# Patient Record
Sex: Female | Born: 1980 | Race: Black or African American | Hispanic: No | Marital: Married | Smoking: Never smoker
Health system: Southern US, Community
[De-identification: ages and names within clinical notes are randomized; demographics above are authoritative.]

## PROBLEM LIST (undated history)

## (undated) ENCOUNTER — Inpatient Hospital Stay (HOSPITAL_COMMUNITY): Payer: Self-pay

## (undated) DIAGNOSIS — E119 Type 2 diabetes mellitus without complications: Secondary | ICD-10-CM

## (undated) HISTORY — PX: NO PAST SURGERIES: SHX2092

## (undated) HISTORY — DX: Type 2 diabetes mellitus without complications: E11.9

## (undated) HISTORY — PX: WISDOM TOOTH EXTRACTION: SHX21

---

## 2011-08-06 ENCOUNTER — Ambulatory Visit (INDEPENDENT_AMBULATORY_CARE_PROVIDER_SITE_OTHER): Payer: 59 | Admitting: Gynecology

## 2011-08-06 ENCOUNTER — Other Ambulatory Visit (HOSPITAL_COMMUNITY)
Admission: RE | Admit: 2011-08-06 | Discharge: 2011-08-06 | Disposition: A | Discharge status: Home / Self Care | Payer: 59 | Source: Ambulatory Visit | Attending: Gynecology | Admitting: Gynecology

## 2011-08-06 ENCOUNTER — Telehealth: Payer: Self-pay | Admitting: *Deleted

## 2011-08-06 ENCOUNTER — Encounter: Payer: Self-pay | Admitting: Gynecology

## 2011-08-06 VITALS — BP 120/70 | Ht 64.25 in | Wt 189.0 lb

## 2011-08-06 DIAGNOSIS — Z131 Encounter for screening for diabetes mellitus: Secondary | ICD-10-CM

## 2011-08-06 DIAGNOSIS — R823 Hemoglobinuria: Secondary | ICD-10-CM

## 2011-08-06 DIAGNOSIS — Z01419 Encounter for gynecological examination (general) (routine) without abnormal findings: Secondary | ICD-10-CM

## 2011-08-06 DIAGNOSIS — Z13220 Encounter for screening for lipoid disorders: Secondary | ICD-10-CM

## 2011-08-06 NOTE — Telephone Encounter (Signed)
PT CALLED SAYING HER PERIOD IS ON AND SHE WANTS TO KNOW IF THAT IS OKAY TO STILL HAVE ANNUAL DONE. PT INFORMED THAT THIS IS OKAY.

## 2011-08-06 NOTE — Progress Notes (Signed)
Latha Staunton 06-13-1981 [id number]        30 y.o.  for annual exam as a new patient with no complaints. Has regular menses using withdrawal birth control that she has used for 10 years affectively.  Past medical history,surgical history, medications, allergies, family history and social history were all reviewed and documented in the EPIC chart. ROS:  Was performed and pertinent positives and negatives are included in the history.  Exam: chaperone present Filed Vitals:   08/06/11 1144  BP: 120/70   General appearance  Normal Skin grossly normal Head/Neck normal with no cervical or supraclavicular adenopathy thyroid normal Lungs  clear Cardiac RR, without RMG Abdominal  soft, nontender, without masses, organomegaly or hernia Breasts  examined lying and sitting without masses, retractions, discharge or axillary adenopathy. Pelvic  Ext/BUS/vagina  normal late menses flow  Cervix  normal  Pap done  Uterus  anteverted, normal size, shape and contour, midline and mobile nontender   Adnexa  Without masses or tenderness    Anus and perineum  normal   Rectovaginal  normal sphincter tone without palpated masses or tenderness.    Assessment/Plan:  30 y.o. female for annual exam.  Doing well no complaints. Using rhythm method which is effective for them. I did review the issues of biological clock and the risks of pregnancy with advanced maternal age to include increased risk for chromosomal abnormalities as well as increased risk for metabolic complications such as diabetes hypertension. Self breast exams on a monthly basis discussed urged. Will check baseline CBC glucose lipid profile urinalysis. Assuming patient continues well she'll see me in a year, sooner as needed. She is on a multivitamin with folic acid and I encouraged her to continue this.    Dara Lords MD, 12:19 PM 08/06/2011

## 2011-08-16 ENCOUNTER — Telehealth: Payer: Self-pay | Admitting: *Deleted

## 2011-08-16 DIAGNOSIS — E7800 Pure hypercholesterolemia, unspecified: Secondary | ICD-10-CM

## 2011-08-16 NOTE — Telephone Encounter (Signed)
LM ON PT VOICE MAIL WITH THE BELOW NOTE. ORDER IN COMPUTER.

## 2011-08-16 NOTE — Telephone Encounter (Signed)
Message copied by Aura Camps on Mon Aug 16, 2011  2:40 PM ------      Message from: Dara Lords      Created: Mon Aug 16, 2011  6:46 AM       Tell patient chol 280 and LDL 183.  Recommend FLP.  If this was fasting, patient needs follow up with her primary to follow and consider RX.

## 2011-08-25 ENCOUNTER — Telehealth: Payer: Self-pay | Admitting: *Deleted

## 2011-08-25 NOTE — Telephone Encounter (Signed)
Pt requested actual numbers on her lab tests, lm of VM of Lp #'s and FBS. [name]

## 2011-11-10 ENCOUNTER — Institutional Professional Consult (permissible substitution): Payer: 59 | Admitting: Gynecology

## 2012-01-25 ENCOUNTER — Institutional Professional Consult (permissible substitution): Payer: 59 | Admitting: Gynecology

## 2012-01-26 ENCOUNTER — Institutional Professional Consult (permissible substitution): Payer: 59 | Admitting: Gynecology

## 2012-03-21 ENCOUNTER — Institutional Professional Consult (permissible substitution): Payer: 59 | Admitting: Gynecology

## 2013-11-29 DIAGNOSIS — E119 Type 2 diabetes mellitus without complications: Secondary | ICD-10-CM

## 2013-11-29 HISTORY — DX: Type 2 diabetes mellitus without complications: E11.9

## 2014-04-01 ENCOUNTER — Other Ambulatory Visit (HOSPITAL_COMMUNITY)
Admission: RE | Admit: 2014-04-01 | Discharge: 2014-04-01 | Disposition: A | Discharge status: Home / Self Care | Payer: 59 | Source: Ambulatory Visit | Attending: Gynecology | Admitting: Gynecology

## 2014-04-01 ENCOUNTER — Encounter: Payer: Self-pay | Admitting: Gynecology

## 2014-04-01 ENCOUNTER — Ambulatory Visit (INDEPENDENT_AMBULATORY_CARE_PROVIDER_SITE_OTHER): Payer: 59 | Admitting: Gynecology

## 2014-04-01 VITALS — BP 120/76 | Ht 64.0 in | Wt 206.0 lb

## 2014-04-01 DIAGNOSIS — Z01419 Encounter for gynecological examination (general) (routine) without abnormal findings: Secondary | ICD-10-CM | POA: Insufficient documentation

## 2014-04-01 DIAGNOSIS — Z1151 Encounter for screening for human papillomavirus (HPV): Secondary | ICD-10-CM | POA: Insufficient documentation

## 2014-04-01 LAB — LIPID PANEL
CHOLESTEROL: 240 mg/dL — AB (ref 0–200)
HDL: 58 mg/dL (ref >39)
LDL Cholesterol: 159 mg/dL — ABNORMAL HIGH (ref 0–99)
Total CHOL/HDL Ratio: 4.1 Ratio
Triglycerides: 114 mg/dL (ref <150)
VLDL: 23 mg/dL (ref 0–40)

## 2014-04-01 LAB — CBC WITH DIFFERENTIAL/PLATELET
Basophils Absolute: 0 10*3/uL (ref 0.0–0.1)
Basophils Relative: 0 % (ref 0–1)
EOS PCT: 3 % (ref 0–5)
Eosinophils Absolute: 0.2 10*3/uL (ref 0.0–0.7)
HCT: 40.2 % (ref 36.0–46.0)
HEMOGLOBIN: 13.8 g/dL (ref 12.0–15.0)
LYMPHS ABS: 2.4 10*3/uL (ref 0.7–4.0)
LYMPHS PCT: 33 % (ref 12–46)
MCH: 27.9 pg (ref 26.0–34.0)
MCHC: 34.3 g/dL (ref 30.0–36.0)
MCV: 81.2 fL (ref 78.0–100.0)
MONOS PCT: 7 % (ref 3–12)
Monocytes Absolute: 0.5 10*3/uL (ref 0.1–1.0)
NEUTROS PCT: 57 % (ref 43–77)
Neutro Abs: 4.2 10*3/uL (ref 1.7–7.7)
PLATELETS: 357 10*3/uL (ref 150–400)
RBC: 4.95 MIL/uL (ref 3.87–5.11)
RDW: 14.3 % (ref 11.5–15.5)
WBC: 7.3 10*3/uL (ref 4.0–10.5)

## 2014-04-01 LAB — COMPREHENSIVE METABOLIC PANEL
ALT: 18 U/L (ref 0–35)
AST: 16 U/L (ref 0–37)
Albumin: 4.3 g/dL (ref 3.5–5.2)
Alkaline Phosphatase: 80 U/L (ref 39–117)
BUN: 11 mg/dL (ref 6–23)
CALCIUM: 9.3 mg/dL (ref 8.4–10.5)
CHLORIDE: 104 meq/L (ref 96–112)
CO2: 22 meq/L (ref 19–32)
Creat: 0.75 mg/dL (ref 0.50–1.10)
Glucose, Bld: 138 mg/dL — ABNORMAL HIGH (ref 70–99)
Potassium: 4 mEq/L (ref 3.5–5.3)
SODIUM: 138 meq/L (ref 135–145)
TOTAL PROTEIN: 7.4 g/dL (ref 6.0–8.3)
Total Bilirubin: 0.3 mg/dL (ref 0.2–1.2)

## 2014-04-01 NOTE — Patient Instructions (Signed)
Followup in one year, sooner as needed. Start on a multivitamin with folic acid such as an over-the-counter prenatal vitamin.  You may obtain a copy of any labs that were done today by logging onto MyChart as outlined in the instructions provided with your AVS (after visit summary). The office will not call with normal lab results but certainly if there are any significant abnormalities then we will contact you.   Health Maintenance, Female A healthy lifestyle and preventative care can promote health and wellness.  Maintain regular health, dental, and eye exams.  Eat a healthy diet. Foods like vegetables, fruits, whole grains, low-fat dairy products, and lean protein foods contain the nutrients you need without too many calories. Decrease your intake of foods high in solid fats, added sugars, and salt. Get information about a proper diet from your caregiver, if necessary.  Regular physical exercise is one of the most important things you can do for your health. Most adults should get at least 150 minutes of moderate-intensity exercise (any activity that increases your heart rate and causes you to sweat) each week. In addition, most adults need muscle-strengthening exercises on 2 or more days a week.   Maintain a healthy weight. The body mass index (BMI) is a screening tool to identify possible weight problems. It provides an estimate of body fat based on height and weight. Your caregiver can help determine your BMI, and can help you achieve or maintain a healthy weight. For adults 20 years and older:  A BMI below 18.5 is considered underweight.  A BMI of 18.5 to 24.9 is normal.  A BMI of 25 to 29.9 is considered overweight.  A BMI of 30 and above is considered obese.  Maintain normal blood lipids and cholesterol by exercising and minimizing your intake of saturated fat. Eat a balanced diet with plenty of fruits and vegetables. Blood tests for lipids and cholesterol should begin at age 30 and  be repeated every 5 years. If your lipid or cholesterol levels are high, you are over 50, or you are a high risk for heart disease, you may need your cholesterol levels checked more frequently.Ongoing high lipid and cholesterol levels should be treated with medicines if diet and exercise are not effective.  If you smoke, find out from your caregiver how to quit. If you do not use tobacco, do not start.  Lung cancer screening is recommended for adults aged 81 80 years who are at high risk for developing lung cancer because of a history of smoking. Yearly low-dose computed tomography (CT) is recommended for people who have at least a 30-pack-year history of smoking and are a current smoker or have quit within the past 15 years. A pack year of smoking is smoking an average of 1 pack of cigarettes a day for 1 year (for example: 1 pack a day for 30 years or 2 packs a day for 15 years). Yearly screening should continue until the smoker has stopped smoking for at least 15 years. Yearly screening should also be stopped for people who develop a health problem that would prevent them from having lung cancer treatment.  If you are pregnant, do not drink alcohol. If you are breastfeeding, be very cautious about drinking alcohol. If you are not pregnant and choose to drink alcohol, do not exceed 1 drink per day. One drink is considered to be 12 ounces (355 mL) of beer, 5 ounces (148 mL) of wine, or 1.5 ounces (44 mL) of liquor.  Avoid  use of street drugs. Do not share needles with anyone. Ask for help if you need support or instructions about stopping the use of drugs.  High blood pressure causes heart disease and increases the risk of stroke. Blood pressure should be checked at least every 1 to 2 years. Ongoing high blood pressure should be treated with medicines, if weight loss and exercise are not effective.  If you are 87 to 33 years old, ask your caregiver if you should take aspirin to prevent  strokes.  Diabetes screening involves taking a blood sample to check your fasting blood sugar level. This should be done once every 3 years, after age 69, if you are within normal weight and without risk factors for diabetes. Testing should be considered at a younger age or be carried out more frequently if you are overweight and have at least 1 risk factor for diabetes.  Breast cancer screening is essential preventative care for women. You should practice "breast self-awareness." This means understanding the normal appearance and feel of your breasts and may include breast self-examination. Any changes detected, no matter how small, should be reported to a caregiver. Women in their 15s and 30s should have a clinical breast exam (CBE) by a caregiver as part of a regular health exam every 1 to 3 years. After age 104, women should have a CBE every year. Starting at age 10, women should consider having a mammogram (breast X-ray) every year. Women who have a family history of breast cancer should talk to their caregiver about genetic screening. Women at a high risk of breast cancer should talk to their caregiver about having an MRI and a mammogram every year.  Breast cancer gene (BRCA)-related cancer risk assessment is recommended for women who have family members with BRCA-related cancers. BRCA-related cancers include breast, ovarian, tubal, and peritoneal cancers. Having family members with these cancers may be associated with an increased risk for harmful changes (mutations) in the breast cancer genes BRCA1 and BRCA2. Results of the assessment will determine the need for genetic counseling and BRCA1 and BRCA2 testing.  The Pap test is a screening test for cervical cancer. Women should have a Pap test starting at age 47. Between ages 18 and 59, Pap tests should be repeated every 2 years. Beginning at age 1, you should have a Pap test every 3 years as long as the past 3 Pap tests have been normal. If you had a  hysterectomy for a problem that was not cancer or a condition that could lead to cancer, then you no longer need Pap tests. If you are between ages 3 and 34, and you have had normal Pap tests going back 10 years, you no longer need Pap tests. If you have had past treatment for cervical cancer or a condition that could lead to cancer, you need Pap tests and screening for cancer for at least 20 years after your treatment. If Pap tests have been discontinued, risk factors (such as a new sexual partner) need to be reassessed to determine if screening should be resumed. Some women have medical problems that increase the chance of getting cervical cancer. In these cases, your caregiver may recommend more frequent screening and Pap tests.  The human papillomavirus (HPV) test is an additional test that may be used for cervical cancer screening. The HPV test looks for the virus that can cause the cell changes on the cervix. The cells collected during the Pap test can be tested for HPV. The HPV  test could be used to screen women aged 85 years and older, and should be used in women of any age who have unclear Pap test results. After the age of 58, women should have HPV testing at the same frequency as a Pap test.  Colorectal cancer can be detected and often prevented. Most routine colorectal cancer screening begins at the age of 49 and continues through age 69. However, your caregiver may recommend screening at an earlier age if you have risk factors for colon cancer. On a yearly basis, your caregiver may provide home test kits to check for hidden blood in the stool. Use of a small camera at the end of a tube, to directly examine the colon (sigmoidoscopy or colonoscopy), can detect the earliest forms of colorectal cancer. Talk to your caregiver about this at age 78, when routine screening begins. Direct examination of the colon should be repeated every 5 to 10 years through age 46, unless early forms of pre-cancerous  polyps or small growths are found.  Hepatitis C blood testing is recommended for all people born from [birth year] through 1965 and any individual with known risks for hepatitis C.  Practice safe sex. Use condoms and avoid high-risk sexual practices to reduce the spread of sexually transmitted infections (STIs). Sexually active women aged 7 and younger should be checked for Chlamydia, which is a common sexually transmitted infection. Older women with new or multiple partners should also be tested for Chlamydia. Testing for other STIs is recommended if you are sexually active and at increased risk.  Osteoporosis is a disease in which the bones lose minerals and strength with aging. This can result in serious bone fractures. The risk of osteoporosis can be identified using a bone density scan. Women ages 45 and over and women at risk for fractures or osteoporosis should discuss screening with their caregivers. Ask your caregiver whether you should be taking a calcium supplement or vitamin D to reduce the rate of osteoporosis.  Menopause can be associated with physical symptoms and risks. Hormone replacement therapy is available to decrease symptoms and risks. You should talk to your caregiver about whether hormone replacement therapy is right for you.  Use sunscreen. Apply sunscreen liberally and repeatedly throughout the day. You should seek shade when your shadow is shorter than you. Protect yourself by wearing long sleeves, pants, a wide-brimmed hat, and sunglasses year round, whenever you are outdoors.  Notify your caregiver of new moles or changes in moles, especially if there is a change in shape or color. Also notify your caregiver if a mole is larger than the size of a pencil eraser.  Stay current with your immunizations. Document Released: 05/31/2011 Document Revised: 03/12/2013 Document Reviewed: 05/31/2011 [facility] Patient Information 2014 [place].

## 2014-04-01 NOTE — Addendum Note (Signed)
Addended by: Dayna BarkerGARDNER, Amaad Byers K on: 04/01/2014 11:55 AM   Modules accepted: Orders

## 2014-04-01 NOTE — Progress Notes (Signed)
Natalie Morales May 19, 1981 [id number]        33 y.o.  G0P0 for annual exam.  Several issues noted below.  Past medical history,surgical history, problem list, medications, allergies, family history and social history were all reviewed and documented as reviewed in the EPIC chart.  ROS:  12 system ROS performed with pertinent positives and negatives included in the history, assessment and plan.  Included Systems: General, HEENT, Neck, Cardiovascular, Pulmonary, Gastrointestinal, Genitourinary, Musculoskeletal, Dermatologic, Endocrine, Hematological, Neurologic, Psychiatric Additional significant findings : None   Exam: Kim assistant Filed Vitals:   04/01/14 1037  BP: 120/76  Height: 5\' 4"  (1.626 m)  Weight: 206 lb (93.441 kg)   General appearance:  Normal affect, orientation and appearance. Skin: Grossly normal HEENT: Without gross lesions.  No cervical or supraclavicular adenopathy. Thyroid normal.  Lungs:  Clear without wheezing, rales or rhonchi Cardiac: RR, without RMG Abdominal:  Soft, nontender, without masses, guarding, rebound, organomegaly or hernia Breasts:  Examined lying and sitting without masses, retractions, discharge or axillary adenopathy. Bilateral nipple piercing noted. Pelvic:  Ext/BUS/vagina normal  Cervix normal. Pap/HPV  Uterus anteverted, normal size, shape and contour, midline and mobile nontender   Adnexa  Without masses or tenderness    Anus and perineum  Normal   Rectovaginal  Normal sphincter tone without palpated masses or tenderness.    Assessment/Plan:  33 y.o. G0P0 female for annual exam with regular menses, no birth control.   1. Contraception. Patient not using birth control. Not actively trying but not preventing. Would accept pregnancy if it occurs. Currently not on multivitamin with folic acid. Recommended she start a multivitamin with folic acid preconceptionally. We've already discussed the issues of biological clock and advancing maternal age  both from a infertility standpoint as well as chromosomal/metabolic complications of pregnancy issues. Patient not interested in more aggressively pursuing pregnancy at this time. 2. Pap smear 2012. Pap/HPV today. No history of abnormal Pap smears previously. Continue with 3-5 year interval screening. 3. Mammography. Plan closer to 40. No strong family history of breast cancer. SBE monthly reviewed. 4. Health maintenance. Baseline CBC comprehensive metabolic panel lipid profile urinalysis ordered. Followup in one year, sooner as needed.   Note: This document was prepared with digital dictation and possible smart phrase technology. Any transcriptional errors that result from this process are unintentional.   Dara Lordsimothy P Fontaine MD, 11:07 AM 04/01/2014

## 2014-04-02 ENCOUNTER — Other Ambulatory Visit: Payer: Self-pay | Admitting: Gynecology

## 2014-04-02 DIAGNOSIS — R7309 Other abnormal glucose: Secondary | ICD-10-CM

## 2014-04-02 DIAGNOSIS — E7800 Pure hypercholesterolemia, unspecified: Secondary | ICD-10-CM

## 2014-04-02 LAB — URINALYSIS W MICROSCOPIC + REFLEX CULTURE
Bacteria, UA: NONE SEEN
Bilirubin Urine: NEGATIVE
CASTS: NONE SEEN
Crystals: NONE SEEN
Glucose, UA: NEGATIVE mg/dL
Hgb urine dipstick: NEGATIVE
Ketones, ur: NEGATIVE mg/dL
LEUKOCYTES UA: NEGATIVE
Nitrite: NEGATIVE
PH: 5.5 (ref 5.0–8.0)
Protein, ur: NEGATIVE mg/dL
Specific Gravity, Urine: >1.03 — ABNORMAL HIGH (ref 1.005–1.030)
UROBILINOGEN UA: 0.2 mg/dL (ref 0.0–1.0)

## 2014-04-03 ENCOUNTER — Other Ambulatory Visit: Payer: 59

## 2014-04-04 ENCOUNTER — Other Ambulatory Visit: Payer: 59

## 2014-04-04 DIAGNOSIS — E7800 Pure hypercholesterolemia, unspecified: Secondary | ICD-10-CM

## 2014-04-04 DIAGNOSIS — R7309 Other abnormal glucose: Secondary | ICD-10-CM

## 2014-04-04 LAB — GLUCOSE, RANDOM: Glucose, Bld: 123 mg/dL — ABNORMAL HIGH (ref 70–99)

## 2014-04-04 LAB — LIPID PANEL
Cholesterol: 244 mg/dL — ABNORMAL HIGH (ref 0–200)
HDL: 62 mg/dL (ref >39)
LDL Cholesterol: 158 mg/dL — ABNORMAL HIGH (ref 0–99)
Total CHOL/HDL Ratio: 3.9 Ratio
Triglycerides: 120 mg/dL (ref <150)
VLDL: 24 mg/dL (ref 0–40)

## 2014-04-05 LAB — HEMOGLOBIN A1C
HEMOGLOBIN A1C: 6.9 % — AB (ref <5.7)
MEAN PLASMA GLUCOSE: 151 mg/dL — AB (ref <117)

## 2014-04-09 ENCOUNTER — Telehealth: Payer: Self-pay

## 2014-04-09 DIAGNOSIS — E119 Type 2 diabetes mellitus without complications: Secondary | ICD-10-CM

## 2014-04-09 NOTE — Telephone Encounter (Signed)
Patient's labs were diagnostic of diabetes. She did not have PCP so I recommended [facility]. She called and first appt she could get was 05/2714.  She was hoping to get in sooner so she called me.  I called around and all the [place] sites are about 8 weeks out because a lot of the providers are not taking new patients.  The only thing I can think of is if you think it would be ok for us to refer her to Dr. Elvera LennoxGherghe who it endocrinologist with [facility].  Last referral Victorino DikeJennifer did with her took about a week to get the patient seen.  Ok to refer her to her?

## 2014-04-09 NOTE — Telephone Encounter (Signed)
Patient informed that Victorino DikeJennifer will take care of referral to Dr. Elvera LennoxGherghe and she should hear about appt soon.

## 2014-04-09 NOTE — Telephone Encounter (Signed)
ok 

## 2014-04-10 NOTE — Telephone Encounter (Signed)
Referral placed for Dr.Gherghe, they will contact patient

## 2014-04-11 NOTE — Telephone Encounter (Signed)
Appointment 04/16/14 @ 8:15 am

## 2014-04-16 ENCOUNTER — Encounter: Payer: Self-pay | Admitting: Internal Medicine

## 2014-04-16 ENCOUNTER — Ambulatory Visit (INDEPENDENT_AMBULATORY_CARE_PROVIDER_SITE_OTHER): Payer: 59 | Admitting: Internal Medicine

## 2014-04-16 VITALS — BP 112/70 | HR 95 | Temp 98.3°F | Resp 12 | Ht 64.0 in | Wt 206.0 lb

## 2014-04-16 DIAGNOSIS — E119 Type 2 diabetes mellitus without complications: Secondary | ICD-10-CM | POA: Insufficient documentation

## 2014-04-16 LAB — MICROALBUMIN / CREATININE URINE RATIO
Creatinine,U: 262.2 mg/dL
Microalb Creat Ratio: 0.2 mg/g (ref 0.0–30.0)
Microalb, Ur: 0.6 mg/dL (ref 0.0–1.9)

## 2014-04-16 LAB — HEMOGLOBIN A1C: HEMOGLOBIN A1C: 6.8 % — AB (ref 4.6–6.5)

## 2014-04-16 MED ORDER — ONETOUCH LANCETS MISC: Status: DC

## 2014-04-16 MED ORDER — GLUCOSE BLOOD VI STRP: ORAL_STRIP | Status: DC

## 2014-04-16 NOTE — Patient Instructions (Addendum)
Please start checking sugars daily, rotating checks. Please return in 1 month with your sugar log.   Please consider the following ways to cut down carbs and fat and increase fiber and micronutrients in your diet: - substitute whole grain for white bread or pasta - substitute brown rice for white rice - substitute 90-calorie flat bread pieces for slices of bread when possible - substitute sweet potatoes or yams for white potatoes - substitute humus for margarine - substitute tofu for cheese when possible - substitute almond or rice milk for regular milk (would not drink soy milk daily due to concern for soy estrogen influence on breast cancer risk) - substitute dark chocolate for other sweets when possible - substitute water - can add lemon or orange slices for taste - for diet sodas (artificial sweeteners will trick your body that you can eat sweets without getting calories and will lead you to overeating and weight gain in the long run) - do not skip breakfast or other meals (this will slow down the metabolism and will result in more weight gain over time)  - can try smoothies made from fruit and almond/rice milk in am instead of regular breakfast - can also try old-fashioned (not instant) oatmeal made with almond/rice milk in am - order the dressing on the side when eating salad at a restaurant (pour less than half of the dressing on the salad) - eat as little meat as possible - can try juicing, but should not forget that juicing will get rid of the fiber, so would alternate with eating raw veg./fruits or drinking smoothies - use as little oil as possible, even when using olive oil - can dress a salad with a mix of balsamic vinegar and lemon juice, for e.g. - use agave nectar, stevia sugar, or regular sugar rather than artificial sweateners - steam or broil/roast veggies  - snack on veggies/fruit/nuts (unsalted, preferably) when possible, rather than processed foods - reduce or eliminate  aspartame in diet (it is in diet sodas, chewing gum, etc) Read the labels!  Try to read Dr. Katherina RightNeal Barnard's book: "Program for Reversing Diabetes" for the vegan concept and other ideas for healthy eating.   PATIENT INSTRUCTIONS FOR TYPE 2 DIABETES:  DIET AND EXERCISE Diet and exercise is an important part of diabetic treatment.  We recommended aerobic exercise in the form of brisk walking (working between 40-60% of maximal aerobic capacity, similar to brisk walking) for 150 minutes per week (such as 30 minutes five days per week) along with 3 times per week performing 'resistance' training (using various gauge rubber tubes with handles) 5-10 exercises involving the major muscle groups (upper body, lower body and core) performing 10-15 repetitions (or near fatigue) each exercise. Start at half the above goal but build slowly to reach the above goals. If limited by weight, joint pain, or disability, we recommend daily walking in a swimming pool with water up to waist to reduce pressure from joints while allow for adequate exercise.    BLOOD GLUCOSES Monitoring your blood glucoses is important for continued management of your diabetes. Please check your blood glucoses 1-4 times a day: fasting, before meals and at bedtime (you can rotate these measurements - e.g. one day check before the 3 meals, the next day check before 2 of the meals and before bedtime, etc.).   HYPOGLYCEMIA (low blood sugar) Hypoglycemia is usually a reaction to not eating, exercising, or taking too much insulin/ other diabetes drugs.  Symptoms include tremors, sweating,  hunger, confusion, headache, etc. Treat IMMEDIATELY with 15 grams of Carbs:   4 glucose tablets    cup regular juice/soda   2 tablespoons raisins   4 teaspoons sugar   1 tablespoon honey Recheck blood glucose in 15 mins and repeat above if still symptomatic/blood glucose <100.  RECOMMENDATIONS TO REDUCE YOUR RISK OF DIABETIC COMPLICATIONS: * Take your  prescribed MEDICATION(S) * Follow a DIABETIC diet: Complex carbs, fiber rich foods, (monounsaturated and polyunsaturated) fats * AVOID saturated/trans fats, high fat foods, >2,300 mg salt per day. * EXERCISE at least 5 times a week for 30 minutes or preferably daily.  * DO NOT SMOKE OR DRINK more than 1 drink a day. * Check your FEET every day. Do not wear tightfitting shoes. Contact us if you develop an ulcer * See your EYE doctor once a year or more if needed * Get a FLU shot once a year * Get a PNEUMONIA vaccine once before and once after age 33 years  GOALS:  * Your Hemoglobin A1c of <7%  * fasting sugars need to be <130 * after meals sugars need to be <180 (2h after you start eating) * Your Systolic BP should be 140 or lower  * Your Diastolic BP should be 80 or lower  * Your HDL (Good Cholesterol) should be 40 or higher  * Your LDL (Bad Cholesterol) should be 100 or lower. * Your Triglycerides should be 150 or lower  * Your Urine microalbumin (kidney function) should be <30 * Your Body Mass Index should be 25 or lower   We will be glad to help you achieve these goals. Our telephone number is: [phone].

## 2014-04-16 NOTE — Progress Notes (Signed)
Patient ID: Natalie Morales, female   DOB: 29-Oct-1981, 33 y.o.   MRN: [id number]  HPI: Natalie Morales is a 33 y.o.-year-old female, referred by Dr. Audie BoxFontaine, for management of DM2, non-insulin-dependent, uncontrolled, without complications.  Patient has been recently diagnosed with diabetes in 03/2014. Last hemoglobin A1c was: Lab Results  Component Value Date   HGBA1C 6.9* 04/04/2014   Pt is not on meds for her diabetes.  Pt does not check her sugars.  ? Lows; ? hypoglycemia awareness.   Pt's meals are: - Breakfast: skips - Lunch: fast food - Dinner: fast food - Snacks: 2x a day: tortilla chips and salsa + cheese; olives   - no CKD, last BUN/creatinine:  Lab Results  Component Value Date   BUN 11 04/01/2014   CREATININE 0.75 04/01/2014   - last set of lipids: Lab Results  Component Value Date   CHOL 244* 04/04/2014   HDL 62 04/04/2014   LDLCALC 696158* 04/04/2014   TRIG 120 04/04/2014   CHOLHDL 3.9 04/04/2014   - No previous dilated eye exam. Denies blurry vision. - no numbness and tingling in her feet.  Pt does not have FH of DM. Mother has SLE.  Pt is trying to get pregnant.  ROS: Constitutional: no weight gain/loss, + fatigue, no subjective hyperthermia/hypothermia, + nocturia, no increased thirst Eyes: no blurry vision, no xerophthalmia ENT: no sore throat, no nodules palpated in throat, no dysphagia/odynophagia, no hoarseness Cardiovascular: no CP/SOB/palpitations/leg swelling Respiratory: no cough/SOB Gastrointestinal: no N/V/D/C, + heartburn Musculoskeletal: no muscle/joint aches Skin: no rashes; + excessive hair growth Neurological: no tremors/numbness/tingling/dizziness Psychiatric: no depression/anxiety  Past Medical History  Diagnosis Date  . Diabetes mellitus without complication    No past surgical history on file. History   Social History  . Marital Status: Married    Spouse Name: N/A    Number of Children: 0        Occupational History  . 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 Smoking status: Never Smoker   . Smokeless tobacco: Never Used  . Alcohol Use: Yes     Comment: Rare - socially  . Drug Use: No  . Sexual Activity: Yes    Partners: Male    Birth Control/ Protection: None   Meds:  Prenatal vitamins  No Known Allergies  Family History  Problem Relation Age of Onset  . Lupus Mother    PE: BP 112/70  Pulse 95  Temp(Src) 98.3 F (36.8 C) (Oral)  Resp 12  Ht 5\' 4"  (1.626 m)  Wt 206 lb (93.441 kg)  BMI 35.34 kg/m2  SpO2 97%  LMP 03/25/2014 Wt Readings from Last 3 Encounters:  04/16/14 206 lb (93.441 kg)  04/01/14 206 lb (93.441 kg)  08/06/11 189 lb (85.73 kg)   Constitutional: obese, in NAD Eyes: PERRLA, EOMI, no exophthalmos ENT: moist mucous membranes, no thyromegaly, no cervical lymphadenopathy Cardiovascular: RRR, No MRG Respiratory: CTA B Gastrointestinal: abdomen soft, NT, ND, BS+ Musculoskeletal: no deformities, strength intact in all 4 Skin: moist, warm, no rashes, + acanthosis nigricans on neck Neurological: no tremor with outstretched hands, DTR normal in all 4  ASSESSMENT: 1. DM2, non-insulin-dependent, controlled, without complications  PLAN:  1. Patient with new dx of DM2 - We discussed about options for treatment, and I suggested to start working on her diet (given suggestions for healthy substitutions and referred her to nutrition) and try to lose 7-10% of her weight and also start exercising. Will see  her back in 3 mo to see if additional tx is needed.  - we discussed about short and long term complications of DM and how to avoid them  Patient Instructions  Please start checking sugars daily, rotating checks. Please return in 1 month with your sugar log.   - Strongly advised her to start checking sugars at different times of the day - check once a day, rotating checks - given sugar log and advised how to fill it and to bring it at next appt  - we demonstrated  glucometer use and gave her a OneTouch Verio meter.  - Sent strips and lancets to pharmacy - given foot care handout and explained the principles  - given instructions for hypoglycemia management "15-15 rule"  - advised for yearly eye exams >> she will let me know if she needs a referral - She was advised to let me know if she becomes pregnant. - will repeat a HbA1c (to confirm dx of DM) and check an ACR today - Return to clinic in 3 mo with sugar log   Office Visit on 04/16/2014  Component Date Value Ref Range Status  . Microalb, Ur 04/16/2014 0.6  0.0 - 1.9 mg/dL Final  . Creatinine,U 16/10/960405/19/2015 262.2   Final  . Microalb Creat Ratio 04/16/2014 0.2  0.0 - 30.0 mg/g Final  . Hemoglobin A1C 04/16/2014 6.8* 4.6 - 6.5 % Final   Glycemic Control Guidelines for People with Diabetes:Non Diabetic:  <6%Goal of Therapy: <7%Additional Action Suggested:  >8%    Msg sent: Dear Ms Quillian QuinceBliss, HbA1c still elevated, confirming diabetes. No proteins in urine, which is great! Sincerely, Carlus Pavlovristina Mivaan Corbitt MD

## 2014-04-23 ENCOUNTER — Other Ambulatory Visit: Payer: Self-pay

## 2014-05-30 ENCOUNTER — Ambulatory Visit: Payer: 59 | Admitting: *Deleted

## 2014-06-18 ENCOUNTER — Encounter: Payer: Self-pay | Admitting: Internal Medicine

## 2014-06-19 ENCOUNTER — Encounter: Payer: Self-pay | Admitting: Family Medicine

## 2014-06-19 ENCOUNTER — Encounter: Payer: Self-pay | Admitting: Internal Medicine

## 2014-06-24 ENCOUNTER — Ambulatory Visit: Payer: 59 | Admitting: Family Medicine

## 2014-07-08 LAB — HM DIABETES EYE EXAM

## 2014-07-25 ENCOUNTER — Ambulatory Visit (INDEPENDENT_AMBULATORY_CARE_PROVIDER_SITE_OTHER): Payer: 59 | Admitting: Internal Medicine

## 2014-07-25 ENCOUNTER — Encounter: Payer: Self-pay | Admitting: Internal Medicine

## 2014-07-25 VITALS — BP 102/64 | HR 107 | Temp 98.6°F | Resp 12 | Wt 193.0 lb

## 2014-07-25 DIAGNOSIS — E119 Type 2 diabetes mellitus without complications: Secondary | ICD-10-CM

## 2014-07-25 LAB — HEMOGLOBIN A1C: Hgb A1c MFr Bld: 5.9 % (ref 4.6–6.5)

## 2014-07-25 NOTE — Patient Instructions (Addendum)
Continue checking sugars daily, rotating checks. Please return in 3 months with your sugar log.  Please stop at the lab.  KEEP UP THE GREAT WORK!

## 2014-07-25 NOTE — Progress Notes (Signed)
Patient ID: Natalie Morales, female   DOB: 1981-03-12, 33 y.o.   MRN: [id number]  HPI: Natalie Morales is a 33 y.o.-year-old female, initially referred by Dr. Audie Box, for management of DM2, dx 03/2014, diet-controlled, without complications. Last visit 3 mo ago.  She started a vegan diet 1 mo ago >> lost 14 lbs!!! She feels great!  Last hemoglobin A1c was: Lab Results  Component Value Date   HGBA1C 6.8* 04/16/2014   HGBA1C 6.9* 04/04/2014   Pt's diabetes is diet controlled.  Pt was not not checking her sugars, however, she started to do so after our last visit. She checks 1-2 x a day: Lowest: 80s, Highest 140s.  - am: 90-106 - 2h after b'fast: 106, 109 - lunch: 106 - 2h after lunch: 115, 127 - dinner: 80-129 - 2h after dinner: 113-121 - nighttime: 85-108  - no CKD, last BUN/creatinine:  Lab Results  Component Value Date   BUN 11 04/01/2014   CREATININE 0.75 04/01/2014  Last ACR: Microalb, Ur 04/16/2014 0.6  0.0 - 1.9 mg/dL  Creatinine,U 78/29/5621 262.2    Microalb Creat Ratio 04/16/2014 0.2  0.0 - 30.0 mg/g   - last set of lipids: Lab Results  Component Value Date   CHOL 244* 04/04/2014   HDL 62 04/04/2014   LDLCALC 308* 04/04/2014   TRIG 120 04/04/2014   CHOLHDL 3.9 04/04/2014   - last dilated eye exam: 07/08/2014. No DR. Denies blurry vision. - no numbness and tingling in her feet.  I reviewed pt's medications, allergies, PMH, social hx, family hx and no changes required, except as mentioned above.  Pt is trying to get pregnant.  ROS: Constitutional: no weight gain/loss, no fatigue, no subjective hyperthermia/hypothermia, no increased thirst Eyes: no blurry vision, no xerophthalmia ENT: no sore throat, no nodules palpated in throat, no dysphagia/odynophagia, no hoarseness Cardiovascular: no CP/SOB/palpitations/leg swelling Respiratory: + cough/no SOB Gastrointestinal: no N/V/D/C/ heartburn Musculoskeletal: no muscle/joint aches Skin: no rashes Neurological: no  tremors/numbness/tingling/dizziness  PE: BP 102/64  Pulse 107  Temp(Src) 98.6 F (37 C) (Oral)  Resp 12  Wt 193 lb (87.544 kg)  SpO2 96% Wt Readings from Last 3 Encounters:  07/25/14 193 lb (87.544 kg)  04/16/14 206 lb (93.441 kg)  04/01/14 206 lb (93.441 kg)   Constitutional: obese, in NAD Eyes: PERRLA, EOMI, no exophthalmos ENT: moist mucous membranes, no thyromegaly, no cervical lymphadenopathy Cardiovascular: RRR, No MRG Respiratory: CTA B Gastrointestinal: abdomen soft, NT, ND, BS+ Musculoskeletal: no deformities, strength intact in all 4 Skin: moist, warm, no rashes, + acanthosis nigricans on neck Neurological: no tremor with outstretched hands, DTR normal in all 4  ASSESSMENT: 1. DM2, diet-controlled, without complications  PLAN:  1. Patient with new dx of DM2 - at last visit, we discussed about options for treatment, and I suggested to start working on her diet (given suggestions for healthy substitutions and referred her to nutrition) and try to lose 7-10% of her weight and also start exercising. She started a vegan diet and she feels great! She also lost 14 lbs and only started the diet 1 mo ago. Sugars are all at goal. - for now, I suggested to Patient Instructions  Continue checking sugars daily, rotating checks. Please return in 3 months with your sugar log.  Please stop at the lab. KEEP UP THE GREAT WORK! - continue checking sugars at different times of the day - check once a day, rotating checks - given more sugar logs  - up to date with  yearly eye exams - She was advised to let me know if she becomes pregnant. - will repeat a HbA1c today - Return to clinic in 3 mo with sugar log   Office Visit on 07/25/2014  Component Date Value Ref Range Status  . Hemoglobin A1C 07/25/2014 5.9  4.6 - 6.5 % Final   Glycemic Control Guidelines for People with Diabetes:Non Diabetic:  <6%Goal of Therapy: <7%Additional Action Suggested:  >8%   . HM Diabetic Eye Exam  07/08/2014 No Retinopathy  No Retinopathy Final   Excellent hbA1c!

## 2014-10-01 ENCOUNTER — Ambulatory Visit: Payer: 59 | Admitting: Medical

## 2014-10-29 ENCOUNTER — Encounter: Payer: Self-pay | Admitting: Women's Health

## 2014-10-29 ENCOUNTER — Ambulatory Visit (INDEPENDENT_AMBULATORY_CARE_PROVIDER_SITE_OTHER): Payer: 59 | Admitting: Women's Health

## 2014-10-29 VITALS — BP 118/80 | Ht 63.0 in | Wt 192.0 lb

## 2014-10-29 DIAGNOSIS — N912 Amenorrhea, unspecified: Secondary | ICD-10-CM

## 2014-10-29 LAB — PREGNANCY, URINE: Preg Test, Ur: POSITIVE

## 2014-10-29 NOTE — Patient Instructions (Signed)
First Trimester of Pregnancy The first trimester of pregnancy is from week 1 until the end of week 12 (months 1 through 3). A week after a sperm fertilizes an egg, the egg will implant on the wall of the uterus. This embryo will begin to develop into a baby. Genes from you and your partner are forming the baby. The female genes determine whether the baby is a boy or a girl. At 6-8 weeks, the eyes and face are formed, and the heartbeat can be seen on ultrasound. At the end of 12 weeks, all the baby's organs are formed.  Now that you are pregnant, you will want to do everything you can to have a healthy baby. Two of the most important things are to get good prenatal care and to follow your health care provider's instructions. Prenatal care is all the medical care you receive before the baby's birth. This care will help prevent, find, and treat any problems during the pregnancy and childbirth. BODY CHANGES Your body goes through many changes during pregnancy. The changes vary from woman to woman.   You may gain or lose a couple of pounds at first.  You may feel sick to your stomach (nauseous) and throw up (vomit). If the vomiting is uncontrollable, call your health care provider.  You may tire easily.  You may develop headaches that can be relieved by medicines approved by your health care provider.  You may urinate more often. Painful urination may mean you have a bladder infection.  You may develop heartburn as a result of your pregnancy.  You may develop constipation because certain hormones are causing the muscles that push waste through your intestines to slow down.  You may develop hemorrhoids or swollen, bulging veins (varicose veins).  Your breasts may begin to grow larger and become tender. Your nipples may stick out more, and the tissue that surrounds them (areola) may become darker.  Your gums may bleed and may be sensitive to brushing and flossing.  Dark spots or blotches (chloasma,  mask of pregnancy) may develop on your face. This will likely fade after the baby is born.  Your menstrual periods will stop.  You may have a loss of appetite.  You may develop cravings for certain kinds of food.  You may have changes in your emotions from day to day, such as being excited to be pregnant or being concerned that something may go wrong with the pregnancy and baby.  You may have more vivid and strange dreams.  You may have changes in your hair. These can include thickening of your hair, rapid growth, and changes in texture. Some women also have hair loss during or after pregnancy, or hair that feels dry or thin. Your hair will most likely return to normal after your baby is born. WHAT TO EXPECT AT YOUR PRENATAL VISITS During a routine prenatal visit:  You will be weighed to make sure you and the baby are growing normally.  Your blood pressure will be taken.  Your abdomen will be measured to track your baby's growth.  The fetal heartbeat will be listened to starting around week 10 or 12 of your pregnancy.  Test results from any previous visits will be discussed. Your health care provider may ask you:  How you are feeling.  If you are feeling the baby move.  If you have had any abnormal symptoms, such as leaking fluid, bleeding, severe headaches, or abdominal cramping.  If you have any questions. Other tests   that may be performed during your first trimester include:  Blood tests to find your blood type and to check for the presence of any previous infections. They will also be used to check for low iron levels (anemia) and Rh antibodies. Later in the pregnancy, blood tests for diabetes will be done along with other tests if problems develop.  Urine tests to check for infections, diabetes, or protein in the urine.  An ultrasound to confirm the proper growth and development of the baby.  An amniocentesis to check for possible genetic problems.  Fetal screens for  spina bifida and Down syndrome.  You may need other tests to make sure you and the baby are doing well. HOME CARE INSTRUCTIONS  Medicines  Follow your health care provider's instructions regarding medicine use. Specific medicines may be either safe or unsafe to take during pregnancy.  Take your prenatal vitamins as directed.  If you develop constipation, try taking a stool softener if your health care provider approves. Diet  Eat regular, well-balanced meals. Choose a variety of foods, such as meat or vegetable-based protein, fish, milk and low-fat dairy products, vegetables, fruits, and whole grain breads and cereals. Your health care provider will help you determine the amount of weight gain that is right for you.  Avoid raw meat and uncooked cheese. These carry germs that can cause birth defects in the baby.  Eating four or five small meals rather than three large meals a day may help relieve nausea and vomiting. If you start to feel nauseous, eating a few soda crackers can be helpful. Drinking liquids between meals instead of during meals also seems to help nausea and vomiting.  If you develop constipation, eat more high-fiber foods, such as fresh vegetables or fruit and whole grains. Drink enough fluids to keep your urine clear or pale yellow. Activity and Exercise  Exercise only as directed by your health care provider. Exercising will help you:  Control your weight.  Stay in shape.  Be prepared for labor and delivery.  Experiencing pain or cramping in the lower abdomen or low back is a good sign that you should stop exercising. Check with your health care provider before continuing normal exercises.  Try to avoid standing for long periods of time. Move your legs often if you must stand in one place for a long time.  Avoid heavy lifting.  Wear low-heeled shoes, and practice good posture.  You may continue to have sex unless your health care provider directs you  otherwise. Relief of Pain or Discomfort  Wear a good support bra for breast tenderness.   Take warm sitz baths to soothe any pain or discomfort caused by hemorrhoids. Use hemorrhoid cream if your health care provider approves.   Rest with your legs elevated if you have leg cramps or low back pain.  If you develop varicose veins in your legs, wear support hose. Elevate your feet for 15 minutes, 3-4 times a day. Limit salt in your diet. Prenatal Care  Schedule your prenatal visits by the twelfth week of pregnancy. They are usually scheduled monthly at first, then more often in the last 2 months before delivery.  Write down your questions. Take them to your prenatal visits.  Keep all your prenatal visits as directed by your health care provider. Safety  Wear your seat belt at all times when driving.  Make a list of emergency phone numbers, including numbers for family, friends, the hospital, and police and fire departments. General Tips    Ask your health care provider for a referral to a local prenatal education class. Begin classes no later than at the beginning of month 6 of your pregnancy.  Ask for help if you have counseling or nutritional needs during pregnancy. Your health care provider can offer advice or refer you to specialists for help with various needs.  Do not use hot tubs, steam rooms, or saunas.  Do not douche or use tampons or scented sanitary pads.  Do not cross your legs for long periods of time.  Avoid cat litter boxes and soil used by cats. These carry germs that can cause birth defects in the baby and possibly loss of the fetus by miscarriage or stillbirth.  Avoid all smoking, herbs, alcohol, and medicines not prescribed by your health care provider. Chemicals in these affect the formation and growth of the baby.  Schedule a dentist appointment. At home, brush your teeth with a soft toothbrush and be gentle when you floss. SEEK MEDICAL CARE IF:   You have  dizziness.  You have mild pelvic cramps, pelvic pressure, or nagging pain in the abdominal area.  You have persistent nausea, vomiting, or diarrhea.  You have a bad smelling vaginal discharge.  You have pain with urination.  You notice increased swelling in your face, hands, legs, or ankles. SEEK IMMEDIATE MEDICAL CARE IF:   You have a fever.  You are leaking fluid from your vagina.  You have spotting or bleeding from your vagina.  You have severe abdominal cramping or pain.  You have rapid weight gain or loss.  You vomit blood or material that looks like coffee grounds.  You are exposed to German measles and have never had them.  You are exposed to fifth disease or chickenpox.  You develop a severe headache.  You have shortness of breath.  You have any kind of trauma, such as from a fall or a car accident. Document Released: 11/09/2001 Document Revised: 04/01/2014 Document Reviewed: 09/25/2013 Exit[employer] Patient Information 2015 [employer]. This information is not intended to replace advice given to you by your health care provider. Make sure you discuss any questions you have with your health care provider.  

## 2014-10-29 NOTE — Progress Notes (Signed)
Patient ID: Natalie HammondKenlan F Charnley, female   DOB: 1981/07/23, 33 y.o.   MRN: [id number] Presents to confirm pregnancy, had several positive UPT's at home. LMP October 25, normal cycle. Denies vaginal discharge, bleeding, abdominal pain. Type 2 diabetes diet controlled. Reports blood sugar today 106. Taking prenatal vitamin daily.  Exam: Appears well. UPT positive  Early pregnancy Type 2 diabetes - diet controlled  Plan: Viability ultrasound to confirm pregnancy in 2 weeks, continue prenatal vitamins, safe pregnancy behaviors reviewed. Continue blood sugar monitoring.

## 2014-11-01 ENCOUNTER — Ambulatory Visit: Payer: 59 | Admitting: Women's Health

## 2014-11-15 ENCOUNTER — Ambulatory Visit (INDEPENDENT_AMBULATORY_CARE_PROVIDER_SITE_OTHER): Payer: 59 | Admitting: Gynecology

## 2014-11-15 ENCOUNTER — Other Ambulatory Visit: Payer: Self-pay | Admitting: Women's Health

## 2014-11-15 ENCOUNTER — Ambulatory Visit (INDEPENDENT_AMBULATORY_CARE_PROVIDER_SITE_OTHER): Payer: 59

## 2014-11-15 ENCOUNTER — Encounter: Payer: Self-pay | Admitting: Gynecology

## 2014-11-15 DIAGNOSIS — O3680X1 Pregnancy with inconclusive fetal viability, fetus 1: Secondary | ICD-10-CM

## 2014-11-15 DIAGNOSIS — O24911 Unspecified diabetes mellitus in pregnancy, first trimester: Secondary | ICD-10-CM

## 2014-11-15 DIAGNOSIS — N912 Amenorrhea, unspecified: Secondary | ICD-10-CM

## 2014-11-15 LAB — US OB TRANSVAGINAL

## 2014-11-15 NOTE — Patient Instructions (Signed)
Follow up with his obstetrical group in town for continuing obstetrical care.

## 2014-11-15 NOTE — Progress Notes (Signed)
Natalie Morales 1980-12-26 [id number]        33 y.o.  G1P0 presents with her husband for ultrasound. Positive home pregnancy test with LMP 09/22/2014.  Past medical history,surgical history, problem list, medications, allergies, family history and social history were all reviewed and documented in the EPIC chart.  Directed ROS with pertinent positives and negatives documented in the history of present illness/assessment and plan.  Ultrasound shows viable single IUP at 7 weeks 4 days. Normal in appearance. Ovaries appear normal.  Assessment/Plan:  33 y.o. G1P0 with viable IUP at 7 weeks 4 days. Patient [place] for a new OB appointment. Continue on prenatal vitamins daily. Call if any issues in the interim.     Dara LordsFONTAINE,Kamoni Depree P MD, 10:18 AM 11/15/2014

## 2014-11-28 ENCOUNTER — Ambulatory Visit: Payer: 59 | Admitting: Internal Medicine

## 2014-11-29 NOTE — L&D Delivery Note (Signed)
    Delivery Note At 12:57 AM a viable female was delivered via Vaginal, Spontaneous Delivery (Presentation: ; Occiput Anterior). Mild shoulder dystocia. APGAR: 7, 9; weight 8 lb 12.2 oz (3975 g).   Placenta status: Intact, Spontaneous.  Cord: 3 vessels with the following complications: Knot x1.  Cord pH: N/A  Anesthesia: Epidural  Episiotomy:  none Lacerations: 2nd degree;Periurethral Suture Repair: vicryl and 4.0 Est. Blood Loss (mL):  200  Mom to postpartum.  Baby to Couplet care / Skin to Skin.  De Hollingshead 06/24/2015, 1:40 AM  I was gloved and actively involved in the delivery. Shoulders were tight, mild shoulder dystocia encountered McRoberts was employed I was able to easily grasp the anterior axilla and the anterior shoulder released easily Total time was about 30-40 seconds Baby had good tone and color but was late to cry, so [facility] team was called As soon as they arrived baby cried spontaneously and they left room  Placenta spont and grossly intact with 3VC, noted to be very long, approximately 50+ inches There was a true knot noted in cord, midway Baby never had decels until final descent Weight 8+12. EFW had been 8+5 Lacerations repaired in usual fashion with lidocaine for local anesthesia. Aviva Signs, CNM

## 2014-12-20 ENCOUNTER — Encounter: Payer: 59 | Admitting: Advanced Practice Midwife

## 2014-12-23 ENCOUNTER — Encounter: Payer: Self-pay | Admitting: Advanced Practice Midwife

## 2014-12-24 ENCOUNTER — Ambulatory Visit: Payer: 59 | Admitting: Internal Medicine

## 2014-12-25 ENCOUNTER — Encounter: Payer: Self-pay | Admitting: Obstetrics and Gynecology

## 2014-12-30 ENCOUNTER — Encounter: Payer: 59 | Attending: Obstetrics & Gynecology | Admitting: *Deleted

## 2014-12-30 ENCOUNTER — Encounter: Payer: Self-pay | Admitting: Obstetrics & Gynecology

## 2014-12-30 ENCOUNTER — Ambulatory Visit (INDEPENDENT_AMBULATORY_CARE_PROVIDER_SITE_OTHER): Payer: 59 | Admitting: Obstetrics & Gynecology

## 2014-12-30 VITALS — BP 106/87 | HR 82 | Temp 98.5°F | Ht 64.0 in | Wt 190.9 lb

## 2014-12-30 DIAGNOSIS — E119 Type 2 diabetes mellitus without complications: Secondary | ICD-10-CM | POA: Insufficient documentation

## 2014-12-30 DIAGNOSIS — O24119 Pre-existing diabetes mellitus, type 2, in pregnancy, unspecified trimester: Secondary | ICD-10-CM | POA: Insufficient documentation

## 2014-12-30 DIAGNOSIS — Z23 Encounter for immunization: Secondary | ICD-10-CM

## 2014-12-30 DIAGNOSIS — Z713 Dietary counseling and surveillance: Secondary | ICD-10-CM | POA: Diagnosis not present

## 2014-12-30 DIAGNOSIS — O24112 Pre-existing diabetes mellitus, type 2, in pregnancy, second trimester: Secondary | ICD-10-CM

## 2014-12-30 DIAGNOSIS — O0992 Supervision of high risk pregnancy, unspecified, second trimester: Secondary | ICD-10-CM | POA: Insufficient documentation

## 2014-12-30 DIAGNOSIS — O24912 Unspecified diabetes mellitus in pregnancy, second trimester: Secondary | ICD-10-CM

## 2014-12-30 LAB — COMPREHENSIVE METABOLIC PANEL
ALBUMIN: 3.8 g/dL (ref 3.5–5.2)
ALT: 14 U/L (ref 0–35)
AST: 15 U/L (ref 0–37)
Alkaline Phosphatase: 73 U/L (ref 39–117)
BUN: 8 mg/dL (ref 6–23)
CHLORIDE: 105 meq/L (ref 96–112)
CO2: 20 meq/L (ref 19–32)
Calcium: 9.5 mg/dL (ref 8.4–10.5)
Creat: 0.55 mg/dL (ref 0.50–1.10)
GLUCOSE: 84 mg/dL (ref 70–99)
POTASSIUM: 3.8 meq/L (ref 3.5–5.3)
SODIUM: 134 meq/L — AB (ref 135–145)
Total Bilirubin: 0.4 mg/dL (ref 0.2–1.2)
Total Protein: 7 g/dL (ref 6.0–8.3)

## 2014-12-30 LAB — POCT URINALYSIS DIP (DEVICE)
Bilirubin Urine: NEGATIVE
Glucose, UA: NEGATIVE mg/dL
HGB URINE DIPSTICK: NEGATIVE
Ketones, ur: 40 mg/dL — AB
Leukocytes, UA: NEGATIVE
NITRITE: NEGATIVE
PH: 5.5 (ref 5.0–8.0)
Protein, ur: NEGATIVE mg/dL
Specific Gravity, Urine: >=1.03 (ref 1.005–1.030)
Urobilinogen, UA: 1 mg/dL (ref 0.0–1.0)

## 2014-12-30 LAB — TSH: TSH: 0.743 u[IU]/mL (ref 0.350–4.500)

## 2014-12-30 LAB — GLUCOSE, CAPILLARY: GLUCOSE-CAPILLARY: 82 mg/dL (ref 70–99)

## 2014-12-30 MED ORDER — ONETOUCH LANCETS MISC: Status: AC

## 2014-12-30 MED ORDER — GLUCOSE BLOOD VI STRP: ORAL_STRIP | Status: DC

## 2014-12-30 NOTE — Addendum Note (Signed)
Addended by: Rosendo GrosHALPIN, Aryaa Bunting L on: 12/30/2014 04:16 PM   Modules accepted: Orders

## 2014-12-30 NOTE — Progress Notes (Signed)
  Patient was seen on 12/30/14 for Diabetes self-management . The following learning objectives were met by the patient :   States when to check blood glucose levels  Demonstrates proper blood glucose monitoring techniques  States the effect of stress and exercise on blood glucose levels  States the importance of limiting caffeine and abstaining from alcohol and smoking  Plan:  Consider  increasing your activity level by walking daily as tolerated Begin checking BG before breakfast and 1-2 hours after first bit of breakfast, lunch and dinner after  as directed by MD  Take medication  as directed by MD  Blood glucose monitor given: True Track dispensed via [facility] Blood glucose reading: FBS $RemoveBefo'104mg'SMFgiFAKibH$ /dl Will increase order for testing supplies to4X daily  Patient instructed to monitor glucose levels: FBS: 60 - <90 2 hour: <120  Patient received the following handouts:  Nutrition Diabetes and Pregnancy  Patient will be seen for follow-up as needed.

## 2014-12-30 NOTE — Progress Notes (Signed)
   Subjective:    Natalie Morales is a G1P0000 2020w1d being seen today for her first obstetrical visit.  Her obstetrical history is significant for type 2 diabetes. Patient does intend to breast feed. Pregnancy history fully reviewed.  Patient reports no complaints.  Filed Vitals:   12/30/14 0802 12/30/14 0804  BP: 106/87   Pulse: 82   Temp: 98.5 F (36.9 C)   Height:  5\' 4"  (1.626 m)  Weight: 190 lb 14.4 oz (86.592 kg)     HISTORY: OB History  Gravida Para Term Preterm AB SAB TAB Ectopic Multiple Living  1 0 0 0 0 0 0 0 0 0     # Outcome Date GA Lbr Len/2nd Weight Sex Delivery Anes PTL Lv  1 Current              Past Medical History  Diagnosis Date  . Diabetes mellitus without complication    Past Surgical History  Procedure Laterality Date  . Wisdom tooth extraction     Family History  Problem Relation Age of Onset  . Lupus Mother      Exam    Uterus:     Pelvic Exam:    Perineum: No Hemorrhoids   Vulva: normal   Vagina:  normal mucosa   pH: N/a   Cervix: no lesions and nulliparous appearance   Adnexa: normal adnexa   Bony Pelvis: average  System: Breast:  Inspection negative   Skin: normal coloration and turgor, no rashes    Neurologic: oriented, normal mood   Extremities: normal strength, tone, and muscle mass   HEENT sclera clear, anicteric, oropharynx clear, no lesions and thyroid without masses   Mouth/Teeth mucous membranes moist, pharynx normal without lesions and dental hygiene good   Neck supple and no masses   Cardiovascular: regular rate and rhythm   Respiratory:  appears well, vitals normal, no respiratory distress, acyanotic, normal RR, neck free of mass or lymphadenopathy, chest clear, no wheezing, crepitations, rhonchi, normal symmetric air entry   Abdomen: soft, non-tender; bowel sounds normal; no masses,  no organomegaly   Urinary: urethral meatus normal      Assessment:    Pregnancy: G1P0000 Patient Active Problem List   Diagnosis Date Noted  . Diabetes mellitus type 2, controlled, without complications 04/16/2014        Plan:     Initial labs drawn. Prenatal vitamins. Problem list reviewed and updated. Genetic Screening discussed -- quad screen in 1-2 weeks  Ultrasound discussed; fetal survey: ordered.  Follow up in 1 weeks. Urine SW, nutrition,  CBG 4x day   Mario Voong H. 12/30/2014

## 2014-12-30 NOTE — Progress Notes (Signed)
Nutrition note: 1st visit consult & DM diet education Pt has h/o obesity & has had Type 2 DM since 08/2014 but has not received any nutrition education. Pt has gained 1.9# @ 7154w1d, which is wnl. Pt reports eating 3 meals & 1-2 snacks/d. Pt is taking a PNV. Pt reports having nausea occ but no heartburn. NKFA. Pt reports no physical activity currently. Pt received verbal & written education on DM diet during pregnancy. Encouraged physical activity. Discussed wt gain goals of 11-20# or 0.5#/wk. Pt does not have WIC. Pt plans to BF. F/u in 2-4 wks Blondell RevealLaura Korey Prashad, MS, RD, LDN, [facility]

## 2014-12-31 LAB — PRENATAL PROFILE (SOLSTAS)
ANTIBODY SCREEN: NEGATIVE
BASOS PCT: 0 % (ref 0–1)
Basophils Absolute: 0 10*3/uL (ref 0.0–0.1)
Eosinophils Absolute: 0.1 10*3/uL (ref 0.0–0.7)
Eosinophils Relative: 1 % (ref 0–5)
HCT: 38.5 % (ref 36.0–46.0)
HIV 1&2 Ab, 4th Generation: NONREACTIVE
Hemoglobin: 13.2 g/dL (ref 12.0–15.0)
Hepatitis B Surface Ag: NEGATIVE
Lymphocytes Relative: 15 % (ref 12–46)
Lymphs Abs: 2 10*3/uL (ref 0.7–4.0)
MCH: 28 pg (ref 26.0–34.0)
MCHC: 34.3 g/dL (ref 30.0–36.0)
MCV: 81.6 fL (ref 78.0–100.0)
MONOS PCT: 7 % (ref 3–12)
MPV: 8.8 fL (ref 8.6–12.4)
Monocytes Absolute: 0.9 10*3/uL (ref 0.1–1.0)
NEUTROS ABS: 10.3 10*3/uL — AB (ref 1.7–7.7)
Neutrophils Relative %: 77 % (ref 43–77)
Platelets: 345 10*3/uL (ref 150–400)
RBC: 4.72 MIL/uL (ref 3.87–5.11)
RDW: 13.6 % (ref 11.5–15.5)
Rh Type: POSITIVE
Rubella: 2.85 Index — ABNORMAL HIGH (ref <0.90)
WBC: 13.4 10*3/uL — ABNORMAL HIGH (ref 4.0–10.5)

## 2014-12-31 LAB — GC/CHLAMYDIA PROBE AMP
CT Probe RNA: NEGATIVE
GC Probe RNA: NEGATIVE

## 2015-01-01 LAB — HEMOGLOBINOPATHY EVALUATION
HGB A: 96.9 % (ref 96.8–97.8)
HGB S QUANTITAION: 0 %
Hemoglobin Other: 0 %
Hgb A2 Quant: 2.7 % (ref 2.2–3.2)
Hgb F Quant: 0.4 % (ref 0.0–2.0)

## 2015-01-01 LAB — PRESCRIPTION MONITORING PROFILE (19 PANEL)
Amphetamine/Meth: NEGATIVE ng/mL
BENZODIAZEPINE SCREEN, URINE: NEGATIVE ng/mL
BUPRENORPHINE, URINE: NEGATIVE ng/mL
Barbiturate Screen, Urine: NEGATIVE ng/mL
CARISOPRODOL, URINE: NEGATIVE ng/mL
CREATININE, URINE: 190.78 mg/dL (ref >20.0)
Cannabinoid Scrn, Ur: NEGATIVE ng/mL
Cocaine Metabolites: NEGATIVE ng/mL
Fentanyl, Ur: NEGATIVE ng/mL
MDMA URINE: NEGATIVE ng/mL
METHAQUALONE SCREEN (URINE): NEGATIVE ng/mL
Meperidine, Ur: NEGATIVE ng/mL
Methadone Screen, Urine: NEGATIVE ng/mL
Nitrites, Initial: NEGATIVE ug/mL
OPIATE SCREEN, URINE: NEGATIVE ng/mL
Oxycodone Screen, Ur: NEGATIVE ng/mL
PROPOXYPHENE: NEGATIVE ng/mL
Phencyclidine, Ur: NEGATIVE ng/mL
TRAMADOL UR: NEGATIVE ng/mL
Tapentadol, urine: NEGATIVE ng/mL
Zolpidem, Urine: NEGATIVE ng/mL
pH, Initial: 6 pH (ref 4.5–8.9)

## 2015-01-01 LAB — HIV ANTIBODY: HIV 1/2 Antibodies: NEGATIVE

## 2015-01-02 LAB — CULTURE, OB URINE

## 2015-01-02 LAB — CREATININE CLEARANCE, URINE, 24 HOUR
CREATININE 24H UR: 2103 mg/d — AB (ref 700–1800)
CREATININE, URINE: 127.4 mg/dL
Creatinine Clearance: 265 mL/min — ABNORMAL HIGH (ref 75–115)
Creatinine: 0.55 mg/dL (ref 0.50–1.10)

## 2015-01-02 LAB — PROTEIN, URINE, 24 HOUR
PROTEIN 24H UR: 149 mg/d (ref <150)
PROTEIN, URINE: 9 mg/dL (ref 5–24)

## 2015-01-03 ENCOUNTER — Other Ambulatory Visit: Payer: Self-pay | Admitting: Obstetrics & Gynecology

## 2015-01-03 DIAGNOSIS — O24112 Pre-existing diabetes mellitus, type 2, in pregnancy, second trimester: Secondary | ICD-10-CM

## 2015-01-03 MED ORDER — AMPICILLIN 500 MG PO CAPS: 500.0000 mg | ORAL_CAPSULE | Freq: Three times a day (TID) | ORAL | Status: DC

## 2015-01-03 NOTE — Progress Notes (Signed)
UTI treated with ampicillin

## 2015-01-06 ENCOUNTER — Ambulatory Visit (INDEPENDENT_AMBULATORY_CARE_PROVIDER_SITE_OTHER): Payer: 59 | Admitting: Obstetrics & Gynecology

## 2015-01-06 VITALS — BP 114/75 | HR 93 | Temp 98.2°F | Wt 190.1 lb

## 2015-01-06 DIAGNOSIS — O0992 Supervision of high risk pregnancy, unspecified, second trimester: Secondary | ICD-10-CM

## 2015-01-06 DIAGNOSIS — O24112 Pre-existing diabetes mellitus, type 2, in pregnancy, second trimester: Secondary | ICD-10-CM

## 2015-01-06 DIAGNOSIS — E119 Type 2 diabetes mellitus without complications: Secondary | ICD-10-CM

## 2015-01-06 LAB — POCT URINALYSIS DIP (DEVICE)
Bilirubin Urine: NEGATIVE
Glucose, UA: NEGATIVE mg/dL
Hgb urine dipstick: NEGATIVE
KETONES UR: 40 mg/dL — AB
LEUKOCYTES UA: NEGATIVE
Nitrite: NEGATIVE
PROTEIN: NEGATIVE mg/dL
Specific Gravity, Urine: >=1.03 (ref 1.005–1.030)
UROBILINOGEN UA: 0.2 mg/dL (ref 0.0–1.0)
pH: 6 (ref 5.0–8.0)

## 2015-01-06 NOTE — Progress Notes (Signed)
Fasting 90, 95, 90, 84, 100, 89  2hr PP B 122, 130, 99, 93, 98, 114  L 115, 82, 89, 102  D 114, 122, 133, 168 (pizza), 119, 127 Emphasized tighter food control and exercise. Will reevaluate in a couple of weeks. Quad screen today. Anatomy scan and fetal ECHO ordered. No other complaints or concerns.  Routine obstetric precautions reviewed.

## 2015-01-06 NOTE — Progress Notes (Signed)
[facility]

## 2015-01-06 NOTE — Patient Instructions (Signed)
Return to clinic for any obstetric concerns or go to MAU for evaluation  

## 2015-01-06 NOTE — Progress Notes (Signed)
Anatomy U/S 01/27/15 @ 8a with [facility].  Fetal echo 02/18/15 @ 830a with Dr. Elizebeth Brookingotton.

## 2015-01-07 LAB — AFP, QUAD SCREEN
AFP: 35.4 ng/mL
Curr Gest Age: 15.1 wks.days
Down Syndrome Scr Risk Est: 1:2830 {titer}
HCG TOTAL: 97.8 [IU]/mL
INH: 212 pg/mL
Interpretation-AFP: NEGATIVE
MOM FOR AFP: 1.2
MOM FOR HCG: 2.18
MoM for INH: 1.28
OPEN SPINA BIFIDA: NEGATIVE
Osb Risk: 1:13900 {titer}
TRI 18 SCR RISK EST: NEGATIVE
UE3 VALUE: 0.74 ng/mL
uE3 Mom: 1.18

## 2015-01-14 ENCOUNTER — Telehealth: Payer: Self-pay

## 2015-01-14 NOTE — Telephone Encounter (Signed)
Pt stated that she is calling in concern of her FMLA paperwork.  She stated that she called her office and was informed that they have not received the paperwork and that it is due on 01/20/15.  Pt also stated that it is ok to LM on VM.

## 2015-01-15 ENCOUNTER — Telehealth: Payer: Self-pay | Admitting: General Practice

## 2015-01-15 ENCOUNTER — Encounter: Payer: Self-pay | Admitting: *Deleted

## 2015-01-15 DIAGNOSIS — O0992 Supervision of high risk pregnancy, unspecified, second trimester: Secondary | ICD-10-CM

## 2015-01-15 NOTE — Telephone Encounter (Signed)
FMLA paperwork completed and [facility]. Copy made and placed up front for patient to pick up at next visit. Original put in scan folder. Called patient and informed her of FMLA paperwork completed, faxed in for her and that a copy will be waiting for her at her next visit. Patient verbalized understanding and had no questions

## 2015-01-15 NOTE — Telephone Encounter (Signed)
Called patient and informed her paperwork is being worked on and once faxed she will receive a call. Patient verbalized understanding and gratitude. No further questions or concerns.

## 2015-01-20 ENCOUNTER — Ambulatory Visit (INDEPENDENT_AMBULATORY_CARE_PROVIDER_SITE_OTHER): Payer: 59 | Admitting: Obstetrics and Gynecology

## 2015-01-20 ENCOUNTER — Telehealth: Payer: Self-pay

## 2015-01-20 ENCOUNTER — Encounter: Payer: Self-pay | Admitting: Obstetrics and Gynecology

## 2015-01-20 VITALS — BP 116/71 | HR 83 | Temp 98.1°F | Wt 189.4 lb

## 2015-01-20 DIAGNOSIS — O0992 Supervision of high risk pregnancy, unspecified, second trimester: Secondary | ICD-10-CM

## 2015-01-20 DIAGNOSIS — O24112 Pre-existing diabetes mellitus, type 2, in pregnancy, second trimester: Secondary | ICD-10-CM

## 2015-01-20 DIAGNOSIS — E119 Type 2 diabetes mellitus without complications: Secondary | ICD-10-CM

## 2015-01-20 LAB — POCT URINALYSIS DIP (DEVICE)
Bilirubin Urine: NEGATIVE
Glucose, UA: NEGATIVE mg/dL
HGB URINE DIPSTICK: NEGATIVE
Leukocytes, UA: NEGATIVE
NITRITE: NEGATIVE
Protein, ur: NEGATIVE mg/dL
SPECIFIC GRAVITY, URINE: 1.025 (ref 1.005–1.030)
Urobilinogen, UA: 0.2 mg/dL (ref 0.0–1.0)
pH: 5.5 (ref 5.0–8.0)

## 2015-01-20 NOTE — Telephone Encounter (Signed)
Patient here in clinic today-- reports her work will not accept her FMLA paper work because number 6&7 are not filled out. Patient to write down what her employer expects to be written on paperwork and then will bring in new copy of forms for RN to complete and fax back to employer.

## 2015-01-20 NOTE — Progress Notes (Signed)
Patient is doing well without complaints. Patient scheduled for anatomy ultrasound on 2/29 CBGs reviewed and great majority within range, only abnormalities are fasting values ranging from 90-107. Patient is taking a bedtime snack consisting of chips and salsa. Discussed consuming a protein rich snack at bedtime and increasing daily activity. Patient just started a gym membership and is very motivated

## 2015-01-21 NOTE — Telephone Encounter (Signed)
Patient's husband brought in new FMLA forms with information regarding what employer expects. FMLA/disability paperwork re-filled out with information regarding frequency of appointments. Called patient and informed her that paperwork has been faxed to [employer] at [phone]. Explained to patient that Question number 7 will remain "No" has pregnancy is not a condition that has flare-ups. Informed her that she will be covered for appointments and any absences due to pregnancy related illness (visits to ED, MAU, ultrasounds etc.) and for delivery. Informed her this does not cover days that she wakes up and decides she just doesn't feel well. Patient verbalized understanding and gratitude. No further questions or concerns.

## 2015-01-27 ENCOUNTER — Other Ambulatory Visit (HOSPITAL_COMMUNITY): Payer: Self-pay | Admitting: Maternal and Fetal Medicine

## 2015-01-27 ENCOUNTER — Ambulatory Visit (HOSPITAL_COMMUNITY)
Admission: RE | Admit: 2015-01-27 | Discharge: 2015-01-27 | Disposition: A | Discharge status: Home / Self Care | Payer: 59 | Source: Ambulatory Visit | Attending: Obstetrics & Gynecology | Admitting: Obstetrics & Gynecology

## 2015-01-27 ENCOUNTER — Other Ambulatory Visit: Payer: Self-pay | Admitting: Obstetrics & Gynecology

## 2015-01-27 ENCOUNTER — Encounter (HOSPITAL_COMMUNITY): Payer: Self-pay

## 2015-01-27 DIAGNOSIS — Z36 Encounter for antenatal screening of mother: Secondary | ICD-10-CM | POA: Insufficient documentation

## 2015-01-27 DIAGNOSIS — Z3A18 18 weeks gestation of pregnancy: Secondary | ICD-10-CM | POA: Diagnosis not present

## 2015-01-27 DIAGNOSIS — E119 Type 2 diabetes mellitus without complications: Secondary | ICD-10-CM

## 2015-01-27 DIAGNOSIS — O24112 Pre-existing diabetes mellitus, type 2, in pregnancy, second trimester: Secondary | ICD-10-CM

## 2015-01-31 ENCOUNTER — Encounter: Payer: Self-pay | Admitting: *Deleted

## 2015-02-03 ENCOUNTER — Ambulatory Visit (INDEPENDENT_AMBULATORY_CARE_PROVIDER_SITE_OTHER): Payer: 59 | Admitting: Obstetrics and Gynecology

## 2015-02-03 VITALS — BP 115/87 | HR 100 | Temp 98.5°F | Wt 191.1 lb

## 2015-02-03 DIAGNOSIS — O0992 Supervision of high risk pregnancy, unspecified, second trimester: Secondary | ICD-10-CM

## 2015-02-03 DIAGNOSIS — O24112 Pre-existing diabetes mellitus, type 2, in pregnancy, second trimester: Secondary | ICD-10-CM

## 2015-02-03 DIAGNOSIS — E119 Type 2 diabetes mellitus without complications: Secondary | ICD-10-CM

## 2015-02-03 LAB — POCT URINALYSIS DIP (DEVICE)
BILIRUBIN URINE: NEGATIVE
Glucose, UA: NEGATIVE mg/dL
Hgb urine dipstick: NEGATIVE
Leukocytes, UA: NEGATIVE
NITRITE: NEGATIVE
PH: 5.5 (ref 5.0–8.0)
PROTEIN: NEGATIVE mg/dL
Specific Gravity, Urine: 1.015 (ref 1.005–1.030)
Urobilinogen, UA: 0.2 mg/dL (ref 0.0–1.0)

## 2015-02-03 NOTE — Progress Notes (Signed)
34 y.o. G1P0000 at 4144w1d here for routine OB visit. Doing well today. No concerns or complaints. 1. Preexisting T2DM. Not currently on medications. Reviewed blood glucose values today. AM fasting mostly above goal today. Postprandials well controlled. Discussed extensively, patient is going to work on diet in the next week. If no improvement will start medication at follow up visit. Fetal echo scheduled for end of this month.  2. Routine PNC. Lab work reviewed. Anatomy scan completed, limited views of the heart otherwise wnl. FM/PTL precautions reviewed.

## 2015-02-03 NOTE — Progress Notes (Signed)
Copy of FMLA paperwork given to patient

## 2015-02-17 ENCOUNTER — Telehealth: Payer: Self-pay

## 2015-02-17 ENCOUNTER — Ambulatory Visit (INDEPENDENT_AMBULATORY_CARE_PROVIDER_SITE_OTHER): Payer: 59 | Admitting: Obstetrics & Gynecology

## 2015-02-17 VITALS — BP 109/63 | HR 85 | Temp 98.1°F | Wt 192.6 lb

## 2015-02-17 DIAGNOSIS — E119 Type 2 diabetes mellitus without complications: Secondary | ICD-10-CM

## 2015-02-17 DIAGNOSIS — O24119 Pre-existing diabetes mellitus, type 2, in pregnancy, unspecified trimester: Secondary | ICD-10-CM

## 2015-02-17 DIAGNOSIS — O0992 Supervision of high risk pregnancy, unspecified, second trimester: Secondary | ICD-10-CM

## 2015-02-17 MED ORDER — ASPIRIN EC 81 MG PO TBEC: 81.0000 mg | DELAYED_RELEASE_TABLET | Freq: Every day | ORAL | Status: AC

## 2015-02-17 MED ORDER — GLYBURIDE 2.5 MG PO TABS: 1.2500 mg | ORAL_TABLET | Freq: Every day | ORAL | Status: DC

## 2015-02-17 NOTE — Progress Notes (Signed)
Fetal ECHO scheduled on 02/18/15, will follow up results.  Next growth scan is on 02/25/15. Started on ASA 81 mg daily Fastings 93-102 ( four values above 100); 2 hr PP mostly within range, two values of 150, 151 at dinner. Advised to stick to diet, but will add Glyburide 1.25 mg at night, and will titrate up as needed.  No other complaints or concerns.  Routine obstetric precautions reviewed.

## 2015-02-17 NOTE — Telephone Encounter (Signed)
Received a call from Sharon SwazilandJordan, RN a case manager with [facility] as patient has been enrolled in a maternity support program. Purpose of her call is to introduce herself and inform us that she will be in contact with patient once every 6 weeks and will only contact office if she cannot reach patient after two tries. She can be reached at [phone].

## 2015-02-17 NOTE — Patient Instructions (Addendum)
Return to clinic for any scheduled appointments or for any gynecologic concerns as needed.  Contraception Choices Contraception (birth control) is the use of any methods or devices to prevent pregnancy. Below are some methods to help avoid pregnancy. HORMONAL METHODS   Contraceptive implant. This is a thin, plastic tube containing progesterone hormone. It does not contain estrogen hormone. Your health care provider inserts the tube in the inner part of the upper arm. The tube can remain in place for up to 3 years. After 3 years, the implant must be removed. The implant prevents the ovaries from releasing an egg (ovulation), thickens the cervical mucus to prevent sperm from entering the uterus, and thins the lining of the inside of the uterus.  Progesterone-only injections. These injections are given every 3 months by your health care provider to prevent pregnancy. This synthetic progesterone hormone stops the ovaries from releasing eggs. It also thickens cervical mucus and changes the uterine lining. This makes it harder for sperm to survive in the uterus.  Birth control pills. These pills contain estrogen and progesterone hormone. They work by preventing the ovaries from releasing eggs (ovulation). They also cause the cervical mucus to thicken, preventing the sperm from entering the uterus. Birth control pills are prescribed by a health care provider.Birth control pills can also be used to treat heavy periods.  Minipill. This type of birth control pill contains only the progesterone hormone. They are taken every day of each month and must be prescribed by your health care provider.  Birth control patch. The patch contains hormones similar to those in birth control pills. It must be changed once a week and is prescribed by a health care provider.  Vaginal ring. The ring contains hormones similar to those in birth control pills. It is left in the vagina for 3 weeks, removed for 1 week, and then a new  one is put back in place. The patient must be comfortable inserting and removing the ring from the vagina.A health care provider's prescription is necessary.  Emergency contraception. Emergency contraceptives prevent pregnancy after unprotected sexual intercourse. This pill can be taken right after sex or up to 5 days after unprotected sex. It is most effective the sooner you take the pills after having sexual intercourse. Most emergency contraceptive pills are available without a prescription. Check with your pharmacist. Do not use emergency contraception as your only form of birth control. BARRIER METHODS   Female condom. This is a thin sheath (latex or rubber) that is worn over the penis during sexual intercourse. It can be used with spermicide to increase effectiveness.  Female condom. This is a soft, loose-fitting sheath that is put into the vagina before sexual intercourse.  Diaphragm. This is a soft, latex, dome-shaped barrier that must be fitted by a health care provider. It is inserted into the vagina, along with a spermicidal jelly. It is inserted before intercourse. The diaphragm should be left in the vagina for 6 to 8 hours after intercourse.  Cervical cap. This is a round, soft, latex or plastic cup that fits over the cervix and must be fitted by a health care provider. The cap can be left in place for up to 48 hours after intercourse.  Sponge. This is a soft, circular piece of polyurethane foam. The sponge has spermicide in it. It is inserted into the vagina after wetting it and before sexual intercourse.  Spermicides. These are chemicals that kill or block sperm from entering the cervix and uterus. They come  in the form of creams, jellies, suppositories, foam, or tablets. They do not require a prescription. They are inserted into the vagina with an applicator before having sexual intercourse. The process must be repeated every time you have sexual intercourse. INTRAUTERINE  CONTRACEPTION  Intrauterine device (IUD). This is a T-shaped device that is put in a woman's uterus during a menstrual period to prevent pregnancy. There are 2 types:  Copper IUD. This type of IUD is wrapped in copper wire and is placed inside the uterus. Copper makes the uterus and fallopian tubes produce a fluid that kills sperm. It can stay in place for 10 years.  Hormone IUD. This type of IUD contains the hormone progestin (synthetic progesterone). The hormone thickens the cervical mucus and prevents sperm from entering the uterus, and it also thins the uterine lining to prevent implantation of a fertilized egg. The hormone can weaken or kill the sperm that get into the uterus. It can stay in place for 3-5 years, depending on which type of IUD is used. PERMANENT METHODS OF CONTRACEPTION  Female tubal ligation. This is when the woman's fallopian tubes are surgically sealed, tied, or blocked to prevent the egg from traveling to the uterus.  Hysteroscopic sterilization. This involves placing a small coil or insert into each fallopian tube. Your doctor uses a technique called hysteroscopy to do the procedure. The device causes scar tissue to form. This results in permanent blockage of the fallopian tubes, so the sperm cannot fertilize the egg. It takes about 3 months after the procedure for the tubes to become blocked. You must use another form of birth control for these 3 months.  Female sterilization. This is when the female has the tubes that carry sperm tied off (vasectomy).This blocks sperm from entering the vagina during sexual intercourse. After the procedure, the man can still ejaculate fluid (semen). NATURAL PLANNING METHODS  Natural family planning. This is not having sexual intercourse or using a barrier method (condom, diaphragm, cervical cap) on days the woman could become pregnant.  Calendar method. This is keeping track of the length of each menstrual cycle and identifying when you are  fertile.  Ovulation method. This is avoiding sexual intercourse during ovulation.  Symptothermal method. This is avoiding sexual intercourse during ovulation, using a thermometer and ovulation symptoms.  Post-ovulation method. This is timing sexual intercourse after you have ovulated. Regardless of which type or method of contraception you choose, it is important that you use condoms to protect against the transmission of sexually transmitted infections (STIs). Talk with your health care provider about which form of contraception is most appropriate for you. Document Released: 11/15/2005 Document Revised: 11/20/2013 Document Reviewed: 05/10/2013 [facility] Patient Information 2015 [employer] [place]. This information is not intended to replace advice given to you by your health care provider. Make sure you discuss any questions you have with your health care provider.

## 2015-02-25 ENCOUNTER — Ambulatory Visit (HOSPITAL_COMMUNITY): Payer: 59

## 2015-02-25 ENCOUNTER — Encounter (HOSPITAL_COMMUNITY): Payer: Self-pay

## 2015-02-25 ENCOUNTER — Ambulatory Visit (HOSPITAL_COMMUNITY)
Admission: RE | Admit: 2015-02-25 | Discharge: 2015-02-25 | Disposition: A | Discharge status: Home / Self Care | Payer: 59 | Source: Ambulatory Visit | Attending: Obstetrics & Gynecology | Admitting: Obstetrics & Gynecology

## 2015-02-25 ENCOUNTER — Encounter (HOSPITAL_COMMUNITY): Payer: Self-pay | Admitting: Obstetrics & Gynecology

## 2015-02-25 DIAGNOSIS — O24112 Pre-existing diabetes mellitus, type 2, in pregnancy, second trimester: Secondary | ICD-10-CM | POA: Insufficient documentation

## 2015-02-25 DIAGNOSIS — Z36 Encounter for antenatal screening of mother: Secondary | ICD-10-CM | POA: Diagnosis not present

## 2015-02-25 DIAGNOSIS — Z3A22 22 weeks gestation of pregnancy: Secondary | ICD-10-CM | POA: Diagnosis not present

## 2015-02-25 DIAGNOSIS — E119 Type 2 diabetes mellitus without complications: Secondary | ICD-10-CM

## 2015-03-03 ENCOUNTER — Encounter: Payer: Self-pay | Admitting: Family Medicine

## 2015-03-03 ENCOUNTER — Ambulatory Visit (INDEPENDENT_AMBULATORY_CARE_PROVIDER_SITE_OTHER): Payer: 59 | Admitting: Family Medicine

## 2015-03-03 VITALS — BP 107/67 | HR 87 | Temp 98.0°F | Wt 194.9 lb

## 2015-03-03 DIAGNOSIS — O0992 Supervision of high risk pregnancy, unspecified, second trimester: Secondary | ICD-10-CM

## 2015-03-03 DIAGNOSIS — O24112 Pre-existing diabetes mellitus, type 2, in pregnancy, second trimester: Secondary | ICD-10-CM

## 2015-03-03 DIAGNOSIS — O24119 Pre-existing diabetes mellitus, type 2, in pregnancy, unspecified trimester: Secondary | ICD-10-CM

## 2015-03-03 LAB — POCT URINALYSIS DIP (DEVICE)
Bilirubin Urine: NEGATIVE
Glucose, UA: NEGATIVE mg/dL
HGB URINE DIPSTICK: NEGATIVE
Ketones, ur: 15 mg/dL — AB
Leukocytes, UA: NEGATIVE
Nitrite: NEGATIVE
PH: 6 (ref 5.0–8.0)
PROTEIN: NEGATIVE mg/dL
Specific Gravity, Urine: 1.025 (ref 1.005–1.030)
Urobilinogen, UA: 0.2 mg/dL (ref 0.0–1.0)

## 2015-03-03 NOTE — Patient Instructions (Signed)
Gestational Diabetes Mellitus Gestational diabetes mellitus, often simply referred to as gestational diabetes, is a type of diabetes that some women develop during pregnancy. In gestational diabetes, the pancreas does not make enough insulin (a hormone), the cells are less responsive to the insulin that is made (insulin resistance), or both.Normally, insulin moves sugars from food into the tissue cells. The tissue cells use the sugars for energy. The lack of insulin or the lack of normal response to insulin causes excess sugars to build up in the blood instead of going into the tissue cells. As a result, high blood sugar (hyperglycemia) develops. The effect of high sugar (glucose) levels can cause many problems.  RISK FACTORS You have an increased chance of developing gestational diabetes if you have a family history of diabetes and also have one or more of the following risk factors:  A body mass index over 30 (obesity).  A previous pregnancy with gestational diabetes.  An older age at the time of pregnancy. If blood glucose levels are kept in the normal range during pregnancy, women can have a healthy pregnancy. If your blood glucose levels are not well controlled, there may be risks to you, your unborn baby (fetus), your labor and delivery, or your newborn baby.  SYMPTOMS  If symptoms are experienced, they are much like symptoms you would normally expect during pregnancy. The symptoms of gestational diabetes include:   Increased thirst (polydipsia).  Increased urination (polyuria).  Increased urination during the night (nocturia).  Weight loss. This weight loss may be rapid.  Frequent, recurring infections.  Tiredness (fatigue).  Weakness.  Vision changes, such as blurred vision.  Fruity smell to your breath.  Abdominal pain. DIAGNOSIS Diabetes is diagnosed when blood glucose levels are increased. Your blood glucose level may be checked by one or more of the following blood  tests:  A fasting blood glucose test. You will not be allowed to eat for at least 8 hours before a blood sample is taken.  A random blood glucose test. Your blood glucose is checked at any time of the day regardless of when you ate.  A hemoglobin A1c blood glucose test. A hemoglobin A1c test provides information about blood glucose control over the previous 3 months.  An oral glucose tolerance test (OGTT). Your blood glucose is measured after you have not eaten (fasted) for 1-3 hours and then after you drink a glucose-containing beverage. Since the hormones that cause insulin resistance are highest at about 24-28 weeks of a pregnancy, an OGTT is usually performed during that time. If you have risk factors for gestational diabetes, your health care provider may test you for gestational diabetes earlier than 24 weeks of pregnancy. TREATMENT   You will need to take diabetes medicine or insulin daily to keep blood glucose levels in the desired range.  You will need to match insulin dosing with exercise and healthy food choices. The treatment goal is to maintain the before-meal (preprandial), bedtime, and overnight blood glucose level at 60-99 mg/dL during pregnancy. The treatment goal is to further maintain peak after-meal blood sugar (postprandial glucose) level at 100-140 mg/dL. HOME CARE INSTRUCTIONS   Have your hemoglobin A1c level checked twice a year.  Perform daily blood glucose monitoring as directed by your health care provider. It is common to perform frequent blood glucose monitoring.  Monitor urine ketones when you are ill and as directed by your health care provider.  Take your diabetes medicine and insulin as directed by your health care provider   to maintain your blood glucose level in the desired range.  Never run out of diabetes medicine or insulin. It is needed every day.  Adjust insulin based on your intake of carbohydrates. Carbohydrates can raise blood glucose levels but  need to be included in your diet. Carbohydrates provide vitamins, minerals, and fiber which are an essential part of a healthy diet. Carbohydrates are found in fruits, vegetables, whole grains, dairy products, legumes, and foods containing added sugars.  Eat healthy foods. Alternate 3 meals with 3 snacks.  Maintain a healthy weight gain. The usual total expected weight gain varies according to your prepregnancy body mass index (BMI).  Carry a medical alert card or wear your medical alert jewelry.  Carry a 15-gram carbohydrate snack with you at all times to treat low blood glucose (hypoglycemia). Some examples of 15-gram carbohydrate snacks include:  Glucose tablets, 3 or 4.  Glucose gel, 15-gram tube.  Raisins, 2 tablespoons (24 g).  Jelly beans, 6.  Animal crackers, 8.  Fruit juice, regular soda, or low-fat milk, 4 ounces (120 mL).  Gummy treats, 9.  Recognize hypoglycemia. Hypoglycemia during pregnancy occurs with blood glucose levels of 60 mg/dL and below. The risk for hypoglycemia increases when fasting or skipping meals, during or after intense exercise, and during sleep. Hypoglycemia symptoms can include:  Tremors or shakes.  Decreased ability to concentrate.  Sweating.  Increased heart rate.  Headache.  Dry mouth.  Hunger.  Irritability.  Anxiety.  Restless sleep.  Altered speech or coordination.  Confusion.  Treat hypoglycemia promptly. If you are alert and able to safely swallow, follow the 15:15 rule:  Take 15-20 grams of rapid-acting glucose or carbohydrate. Rapid-acting options include glucose gel, glucose tablets, or 4 ounces (120 mL) of fruit juice, regular soda, or low-fat milk.  Check your blood glucose level 15 minutes after taking the glucose.  Take 15-20 grams more of glucose if the repeat blood glucose level is still 70 mg/dL or below.  Eat a meal or snack within 1 hour once blood glucose levels return to normal.  Be alert to polyuria  (excess urination) and polydipsia (excess thirst) which are early signs of hyperglycemia. An early awareness of hyperglycemia allows for prompt treatment. Treat hyperglycemia as directed by your health care provider.  Engage in at least 30 minutes of physical activity a day or as directed by your health care provider. Ten minutes of physical activity timed 30 minutes after each meal is encouraged to control postprandial blood glucose levels.  Adjust your insulin dosing and food intake as needed if you start a new exercise or sport.  Follow your sick-day plan at any time you are unable to eat or drink as usual.  Avoid tobacco and alcohol use.  Keep all follow-up visits as directed by your health care provider.  Follow the advice of your health care provider regarding your prenatal and post-delivery (postpartum) appointments, meal planning, exercise, medicines, vitamins, blood tests, other medical tests, and physical activities.  Perform daily skin and foot care. Examine your skin and feet daily for cuts, bruises, redness, nail problems, bleeding, blisters, or sores.  Brush your teeth and gums at least twice a day and floss at least once a day. Follow up with your dentist regularly.  Schedule an eye exam during the first trimester of your pregnancy or as directed by your health care provider.  Share your diabetes management plan with your workplace or school.  Stay up-to-date with immunizations.  Learn to manage stress.    Obtain ongoing diabetes education and support as needed.  Learn about and consider breastfeeding your baby.  You should have your blood sugar level checked 6-12 weeks after delivery. This is done with an oral glucose tolerance test (OGTT). SEEK MEDICAL CARE IF:   You are unable to eat food or drink fluids for more than 6 hours.  You have nausea and vomiting for more than 6 hours.  You have a blood glucose level of 200 mg/dL and you have ketones in your  urine.  There is a change in mental status.  You develop vision problems.  You have a persistent headache.  You have upper abdominal pain or discomfort.  You develop an additional serious illness.  You have diarrhea for more than 6 hours.  You have been sick or have had a fever for a couple of days and are not getting better. SEEK IMMEDIATE MEDICAL CARE IF:   You have difficulty breathing.  You no longer feel the baby moving.  You are bleeding or have discharge from your vagina.  You start having premature contractions or labor. MAKE SURE YOU:  Understand these instructions.  Will watch your condition.  Will get help right away if you are not doing well or get worse. Document Released: 02/21/2001 Document Revised: 04/01/2014 Document Reviewed: 06/13/2012 [employer] Patient Information 2015 [employer]. This information is not intended to replace advice given to you by your health care provider. Make sure you discuss any questions you have with your health care provider.  Breastfeeding Deciding to breastfeed is one of the best choices you can make for you and your baby. A change in hormones during pregnancy causes your breast tissue to grow and increases the number and size of your milk ducts. These hormones also allow proteins, sugars, and fats from your blood supply to make breast milk in your milk-producing glands. Hormones prevent breast milk from being released before your baby is born as well as prompt milk flow after birth. Once breastfeeding has begun, thoughts of your baby, as well as his or her sucking or crying, can stimulate the release of milk from your milk-producing glands.  BENEFITS OF BREASTFEEDING For Your Baby  Your first milk (colostrum) helps your baby's digestive system function better.   There are antibodies in your milk that help your baby fight off infections.   Your baby has a lower incidence of asthma, allergies, and sudden infant death  syndrome.   The nutrients in breast milk are better for your baby than infant formulas and are designed uniquely for your baby's needs.   Breast milk improves your baby's brain development.   Your baby is less likely to develop other conditions, such as childhood obesity, asthma, or type 2 diabetes mellitus.  For You   Breastfeeding helps to create a very special bond between you and your baby.   Breastfeeding is convenient. Breast milk is always available at the correct temperature and costs nothing.   Breastfeeding helps to burn calories and helps you lose the weight gained during pregnancy.   Breastfeeding makes your uterus contract to its prepregnancy size faster and slows bleeding (lochia) after you give birth.   Breastfeeding helps to lower your risk of developing type 2 diabetes mellitus, osteoporosis, and breast or ovarian cancer later in life. SIGNS THAT YOUR BABY IS HUNGRY Early Signs of Hunger  Increased alertness or activity.  Stretching.  Movement of the head from side to side.  Movement of the head and opening of the   mouth when the corner of the mouth or cheek is stroked (rooting).  Increased sucking sounds, smacking lips, cooing, sighing, or squeaking.  Hand-to-mouth movements.  Increased sucking of fingers or hands. Late Signs of Hunger  Fussing.  Intermittent crying. Extreme Signs of Hunger Signs of extreme hunger will require calming and consoling before your baby will be able to breastfeed successfully. Do not wait for the following signs of extreme hunger to occur before you initiate breastfeeding:   Restlessness.  A loud, strong cry.   Screaming. BREASTFEEDING BASICS Breastfeeding Initiation  Find a comfortable place to sit or lie down, with your neck and back well supported.  Place a pillow or rolled up blanket under your baby to bring him or her to the level of your breast (if you are seated). Nursing pillows are specially designed  to help support your arms and your baby while you breastfeed.  Make sure that your baby's abdomen is facing your abdomen.   Gently massage your breast. With your fingertips, massage from your chest wall toward your nipple in a circular motion. This encourages milk flow. You may need to continue this action during the feeding if your milk flows slowly.  Support your breast with 4 fingers underneath and your thumb above your nipple. Make sure your fingers are well away from your nipple and your baby's mouth.   Stroke your baby's lips gently with your finger or nipple.   When your baby's mouth is open wide enough, quickly bring your baby to your breast, placing your entire nipple and as much of the colored area around your nipple (areola) as possible into your baby's mouth.   More areola should be visible above your baby's upper lip than below the lower lip.   Your baby's tongue should be between his or her lower gum and your breast.   Ensure that your baby's mouth is correctly positioned around your nipple (latched). Your baby's lips should create a seal on your breast and be turned out (everted).  It is common for your baby to suck about 2-3 minutes in order to start the flow of breast milk. Latching Teaching your baby how to latch on to your breast properly is very important. An improper latch can cause nipple pain and decreased milk supply for you and poor weight gain in your baby. Also, if your baby is not latched onto your nipple properly, he or she may swallow some air during feeding. This can make your baby fussy. Burping your baby when you switch breasts during the feeding can help to get rid of the air. However, teaching your baby to latch on properly is still the best way to prevent fussiness from swallowing air while breastfeeding. Signs that your baby has successfully latched on to your nipple:    Silent tugging or silent sucking, without causing you pain.   Swallowing  heard between every 3-4 sucks.    Muscle movement above and in front of his or her ears while sucking.  Signs that your baby has not successfully latched on to nipple:   Sucking sounds or smacking sounds from your baby while breastfeeding.  Nipple pain. If you think your baby has not latched on correctly, slip your finger into the corner of your baby's mouth to break the suction and place it between your baby's gums. Attempt breastfeeding initiation again. Signs of Successful Breastfeeding Signs from your baby:   A gradual decrease in the number of sucks or complete cessation of sucking.     Falling asleep.   Relaxation of his or her body.   Retention of a small amount of milk in his or her mouth.   Letting go of your breast by himself or herself. Signs from you:  Breasts that have increased in firmness, weight, and size 1-3 hours after feeding.   Breasts that are softer immediately after breastfeeding.  Increased milk volume, as well as a change in milk consistency and color by the fifth day of breastfeeding.   Nipples that are not sore, cracked, or bleeding. Signs That Your Baby is Getting Enough Milk  Wetting at least 3 diapers in a 24-hour period. The urine should be clear and pale yellow by age 5 days.  At least 3 stools in a 24-hour period by age 5 days. The stool should be soft and yellow.  At least 3 stools in a 24-hour period by age 7 days. The stool should be seedy and yellow.  No loss of weight greater than 10% of birth weight during the first 3 days of age.  Average weight gain of 4-7 ounces (113-198 g) per week after age 4 days.  Consistent daily weight gain by age 5 days, without weight loss after the age of 2 weeks. After a feeding, your baby may spit up a small amount. This is common. BREASTFEEDING FREQUENCY AND DURATION Frequent feeding will help you make more milk and can prevent sore nipples and breast engorgement. Breastfeed when you feel the  need to reduce the fullness of your breasts or when your baby shows signs of hunger. This is called "breastfeeding on demand." Avoid introducing a pacifier to your baby while you are working to establish breastfeeding (the first 4-6 weeks after your baby is born). After this time you may choose to use a pacifier. Research has shown that pacifier use during the first year of a baby's life decreases the risk of sudden infant death syndrome (SIDS). Allow your baby to feed on each breast as long as he or she wants. Breastfeed until your baby is finished feeding. When your baby unlatches or falls asleep while feeding from the first breast, offer the second breast. Because newborns are often sleepy in the first few weeks of life, you may need to awaken your baby to get him or her to feed. Breastfeeding times will vary from baby to baby. However, the following rules can serve as a guide to help you ensure that your baby is properly fed:  Newborns (babies 4 weeks of age or younger) may breastfeed every 1-3 hours.  Newborns should not go longer than 3 hours during the day or 5 hours during the night without breastfeeding.  You should breastfeed your baby a minimum of 8 times in a 24-hour period until you begin to introduce solid foods to your baby at around 6 months of age. BREAST MILK PUMPING Pumping and storing breast milk allows you to ensure that your baby is exclusively fed your breast milk, even at times when you are unable to breastfeed. This is especially important if you are going back to work while you are still breastfeeding or when you are not able to be present during feedings. Your lactation consultant can give you guidelines on how long it is safe to store breast milk.  A breast pump is a machine that allows you to pump milk from your breast into a sterile bottle. The pumped breast milk can then be stored in a refrigerator or freezer. Some breast pumps are operated by   hand, while others use  electricity. Ask your lactation consultant which type will work best for you. Breast pumps can be purchased, but some hospitals and breastfeeding support groups lease breast pumps on a monthly basis. A lactation consultant can teach you how to hand express breast milk, if you prefer not to use a pump.  CARING FOR YOUR BREASTS WHILE YOU BREASTFEED Nipples can become dry, cracked, and sore while breastfeeding. The following recommendations can help keep your breasts moisturized and healthy:  Avoid using soap on your nipples.   Wear a supportive bra. Although not required, special nursing bras and tank tops are designed to allow access to your breasts for breastfeeding without taking off your entire bra or top. Avoid wearing underwire-style bras or extremely tight bras.  Air dry your nipples for 3-4minutes after each feeding.   Use only cotton bra pads to absorb leaked breast milk. Leaking of breast milk between feedings is normal.   Use lanolin on your nipples after breastfeeding. Lanolin helps to maintain your skin's normal moisture barrier. If you use pure lanolin, you do not need to wash it off before feeding your baby again. Pure lanolin is not toxic to your baby. You may also hand express a few drops of breast milk and gently massage that milk into your nipples and allow the milk to air dry. In the first few weeks after giving birth, some women experience extremely full breasts (engorgement). Engorgement can make your breasts feel heavy, warm, and tender to the touch. Engorgement peaks within 3-5 days after you give birth. The following recommendations can help ease engorgement:  Completely empty your breasts while breastfeeding or pumping. You may want to start by applying warm, moist heat (in the shower or with warm water-soaked hand towels) just before feeding or pumping. This increases circulation and helps the milk flow. If your baby does not completely empty your breasts while  breastfeeding, pump any extra milk after he or she is finished.  Wear a snug bra (nursing or regular) or tank top for 1-2 days to signal your body to slightly decrease milk production.  Apply ice packs to your breasts, unless this is too uncomfortable for you.  Make sure that your baby is latched on and positioned properly while breastfeeding. If engorgement persists after 48 hours of following these recommendations, contact your health care provider or a lactation consultant. OVERALL HEALTH CARE RECOMMENDATIONS WHILE BREASTFEEDING  Eat healthy foods. Alternate between meals and snacks, eating 3 of each per day. Because what you eat affects your breast milk, some of the foods may make your baby more irritable than usual. Avoid eating these foods if you are sure that they are negatively affecting your baby.  Drink milk, fruit juice, and water to satisfy your thirst (about 10 glasses a day).   Rest often, relax, and continue to take your prenatal vitamins to prevent fatigue, stress, and anemia.  Continue breast self-awareness checks.  Avoid chewing and smoking tobacco.  Avoid alcohol and drug use. Some medicines that may be harmful to your baby can pass through breast milk. It is important to ask your health care provider before taking any medicine, including all over-the-counter and prescription medicine as well as vitamin and herbal supplements. It is possible to become pregnant while breastfeeding. If birth control is desired, ask your health care provider about options that will be safe for your baby. SEEK MEDICAL CARE IF:   You feel like you want to stop breastfeeding or have become   frustrated with breastfeeding.  You have painful breasts or nipples.  Your nipples are cracked or bleeding.  Your breasts are red, tender, or warm.  You have a swollen area on either breast.  You have a fever or chills.  You have nausea or vomiting.  You have drainage other than breast milk from  your nipples.  Your breasts do not become full before feedings by the fifth day after you give birth.  You feel sad and depressed.  Your baby is too sleepy to eat well.  Your baby is having trouble sleeping.   Your baby is wetting less than 3 diapers in a 24-hour period.  Your baby has less than 3 stools in a 24-hour period.  Your baby's skin or the white part of his or her eyes becomes yellow.   Your baby is not gaining weight by 5 days of age. SEEK IMMEDIATE MEDICAL CARE IF:   Your baby is overly tired (lethargic) and does not want to wake up and feed.  Your baby develops an unexplained fever. Document Released: 11/15/2005 Document Revised: 11/20/2013 Document Reviewed: 05/09/2013 [employer] Patient Information 2015 [employer]. This information is not intended to replace advice given to you by your health care provider. Make sure you discuss any questions you have with your health care provider.  

## 2015-03-03 NOTE — Progress Notes (Signed)
+  ketones in urine

## 2015-03-03 NOTE — Progress Notes (Signed)
FBS 85-103 most are out of range 2 hr pp 70-144 almost all are in range Lengthy discussion had with patient and partner around medications in pregnancy--options, advantages of Glyburide over Glucophage and insulin.  She will remain on Glyburide for now. Add exercise 30 min/day

## 2015-03-17 ENCOUNTER — Ambulatory Visit (INDEPENDENT_AMBULATORY_CARE_PROVIDER_SITE_OTHER): Payer: 59 | Admitting: Obstetrics & Gynecology

## 2015-03-17 VITALS — BP 114/69 | HR 95 | Temp 98.4°F | Wt 195.6 lb

## 2015-03-17 DIAGNOSIS — O24119 Pre-existing diabetes mellitus, type 2, in pregnancy, unspecified trimester: Secondary | ICD-10-CM

## 2015-03-17 DIAGNOSIS — E119 Type 2 diabetes mellitus without complications: Secondary | ICD-10-CM

## 2015-03-17 DIAGNOSIS — O0992 Supervision of high risk pregnancy, unspecified, second trimester: Secondary | ICD-10-CM

## 2015-03-17 LAB — POCT URINALYSIS DIP (DEVICE)
BILIRUBIN URINE: NEGATIVE
Glucose, UA: NEGATIVE mg/dL
Hgb urine dipstick: NEGATIVE
Ketones, ur: NEGATIVE mg/dL
LEUKOCYTES UA: NEGATIVE
NITRITE: NEGATIVE
PH: 5.5 (ref 5.0–8.0)
PROTEIN: NEGATIVE mg/dL
Specific Gravity, Urine: 1.025 (ref 1.005–1.030)
UROBILINOGEN UA: 0.2 mg/dL (ref 0.0–1.0)

## 2015-03-17 MED ORDER — GLYBURIDE 2.5 MG PO TABS: 2.5000 mg | ORAL_TABLET | Freq: Every day | ORAL | Status: DC

## 2015-03-17 NOTE — Patient Instructions (Signed)
Second Trimester of Pregnancy The second trimester is from week 13 through week 28, months 4 through 6. The second trimester is often a time when you feel your best. Your body has also adjusted to being pregnant, and you begin to feel better physically. Usually, morning sickness has lessened or quit completely, you may have more energy, and you may have an increase in appetite. The second trimester is also a time when the fetus is growing rapidly. At the end of the sixth month, the fetus is about 9 inches long and weighs about 1 pounds. You will likely begin to feel the baby move (quickening) between 18 and 20 weeks of the pregnancy. BODY CHANGES Your body goes through many changes during pregnancy. The changes vary from woman to woman.   Your weight will continue to increase. You will notice your lower abdomen bulging out.  You may begin to get stretch marks on your hips, abdomen, and breasts.  You may develop headaches that can be relieved by medicines approved by your health care provider.  You may urinate more often because the fetus is pressing on your bladder.  You may develop or continue to have heartburn as a result of your pregnancy.  You may develop constipation because certain hormones are causing the muscles that push waste through your intestines to slow down.  You may develop hemorrhoids or swollen, bulging veins (varicose veins).  You may have back pain because of the weight gain and pregnancy hormones relaxing your joints between the bones in your pelvis and as a result of a shift in weight and the muscles that support your balance.  Your breasts will continue to grow and be tender.  Your gums may bleed and may be sensitive to brushing and flossing.  Dark spots or blotches (chloasma, mask of pregnancy) may develop on your face. This will likely fade after the baby is born.  A dark line from your belly button to the pubic area (linea nigra) may appear. This will likely fade  after the baby is born.  You may have changes in your hair. These can include thickening of your hair, rapid growth, and changes in texture. Some women also have hair loss during or after pregnancy, or hair that feels dry or thin. Your hair will most likely return to normal after your baby is born. WHAT TO EXPECT AT YOUR PRENATAL VISITS During a routine prenatal visit:  You will be weighed to make sure you and the fetus are growing normally.  Your blood pressure will be taken.  Your abdomen will be measured to track your baby's growth.  The fetal heartbeat will be listened to.  Any test results from the previous visit will be discussed. Your health care provider may ask you:  How you are feeling.  If you are feeling the baby move.  If you have had any abnormal symptoms, such as leaking fluid, bleeding, severe headaches, or abdominal cramping.  If you have any questions. Other tests that may be performed during your second trimester include:  Blood tests that check for:  Low iron levels (anemia).  Gestational diabetes (between 24 and 28 weeks).  Rh antibodies.  Urine tests to check for infections, diabetes, or protein in the urine.  An ultrasound to confirm the proper growth and development of the baby.  An amniocentesis to check for possible genetic problems.  Fetal screens for spina bifida and Down syndrome. HOME CARE INSTRUCTIONS   Avoid all smoking, herbs, alcohol, and unprescribed   drugs. These chemicals affect the formation and growth of the baby.  Follow your health care provider's instructions regarding medicine use. There are medicines that are either safe or unsafe to take during pregnancy.  Exercise only as directed by your health care provider. Experiencing uterine cramps is a good sign to stop exercising.  Continue to eat regular, healthy meals.  Wear a good support bra for breast tenderness.  Do not use hot tubs, steam rooms, or saunas.  Wear your  seat belt at all times when driving.  Avoid raw meat, uncooked cheese, cat litter boxes, and soil used by cats. These carry germs that can cause birth defects in the baby.  Take your prenatal vitamins.  Try taking a stool softener (if your health care provider approves) if you develop constipation. Eat more high-fiber foods, such as fresh vegetables or fruit and whole grains. Drink plenty of fluids to keep your urine clear or pale yellow.  Take warm sitz baths to soothe any pain or discomfort caused by hemorrhoids. Use hemorrhoid cream if your health care provider approves.  If you develop varicose veins, wear support hose. Elevate your feet for 15 minutes, 3-4 times a day. Limit salt in your diet.  Avoid heavy lifting, wear low heel shoes, and practice good posture.  Rest with your legs elevated if you have leg cramps or low back pain.  Visit your dentist if you have not gone yet during your pregnancy. Use a soft toothbrush to brush your teeth and be gentle when you floss.  A sexual relationship may be continued unless your health care provider directs you otherwise.  Continue to go to all your prenatal visits as directed by your health care provider. SEEK MEDICAL CARE IF:   You have dizziness.  You have mild pelvic cramps, pelvic pressure, or nagging pain in the abdominal area.  You have persistent nausea, vomiting, or diarrhea.  You have a bad smelling vaginal discharge.  You have pain with urination. SEEK IMMEDIATE MEDICAL CARE IF:   You have a fever.  You are leaking fluid from your vagina.  You have spotting or bleeding from your vagina.  You have severe abdominal cramping or pain.  You have rapid weight gain or loss.  You have shortness of breath with chest pain.  You notice sudden or extreme swelling of your face, hands, ankles, feet, or legs.  You have not felt your baby move in over an hour.  You have severe headaches that do not go away with  medicine.  You have vision changes. Document Released: 11/09/2001 Document Revised: 11/20/2013 Document Reviewed: 01/16/2013 Exit[employer] Patient Information 2015 [employer]. This information is not intended to replace advice given to you by your health care provider. Make sure you discuss any questions you have with your health care provider.  

## 2015-03-17 NOTE — Progress Notes (Signed)
FBS 81-95 most, only few above, PP 83-130, rare out of rang, continue glyburide 2.5

## 2015-03-31 ENCOUNTER — Ambulatory Visit (INDEPENDENT_AMBULATORY_CARE_PROVIDER_SITE_OTHER): Payer: 59 | Admitting: Obstetrics & Gynecology

## 2015-03-31 VITALS — BP 144/66 | HR 98 | Temp 97.6°F | Wt 199.8 lb

## 2015-03-31 DIAGNOSIS — O24119 Pre-existing diabetes mellitus, type 2, in pregnancy, unspecified trimester: Secondary | ICD-10-CM

## 2015-03-31 DIAGNOSIS — O24112 Pre-existing diabetes mellitus, type 2, in pregnancy, second trimester: Secondary | ICD-10-CM

## 2015-03-31 DIAGNOSIS — O0992 Supervision of high risk pregnancy, unspecified, second trimester: Secondary | ICD-10-CM

## 2015-03-31 LAB — POCT URINALYSIS DIP (DEVICE)
Bilirubin Urine: NEGATIVE
Glucose, UA: NEGATIVE mg/dL
HGB URINE DIPSTICK: NEGATIVE
Ketones, ur: NEGATIVE mg/dL
Nitrite: NEGATIVE
PROTEIN: NEGATIVE mg/dL
Specific Gravity, Urine: 1.025 (ref 1.005–1.030)
UROBILINOGEN UA: 0.2 mg/dL (ref 0.0–1.0)
pH: 5.5 (ref 5.0–8.0)

## 2015-03-31 NOTE — Progress Notes (Signed)
fastings 88-102  Most are in low 90s.  Will keep evening glyburide at 2.5mg   If continues to increase.   C/o left sciatica pain--pregnancy belt, stretching, maternity yoga. BP manual--112-70 Patient denies HA, scotomata, swelling, RUQ pain. BP check in one week.  Probable mechanical error with automated BP machine.  Labs and tdap at next visit.

## 2015-03-31 NOTE — Progress Notes (Signed)
Leukocytes: Trace 

## 2015-03-31 NOTE — Progress Notes (Signed)
Patient reports occasional pressure

## 2015-03-31 NOTE — Progress Notes (Signed)
U/S 04/07/15 @ 945a with Radiology.

## 2015-04-07 ENCOUNTER — Ambulatory Visit (HOSPITAL_COMMUNITY)
Admission: RE | Admit: 2015-04-07 | Discharge: 2015-04-07 | Disposition: A | Discharge status: Home / Self Care | Payer: 59 | Source: Ambulatory Visit | Attending: Obstetrics & Gynecology | Admitting: Obstetrics & Gynecology

## 2015-04-07 DIAGNOSIS — O24119 Pre-existing diabetes mellitus, type 2, in pregnancy, unspecified trimester: Secondary | ICD-10-CM | POA: Diagnosis not present

## 2015-04-14 ENCOUNTER — Encounter: Payer: 59 | Admitting: Obstetrics & Gynecology

## 2015-04-21 ENCOUNTER — Telehealth: Payer: Self-pay | Admitting: *Deleted

## 2015-04-21 ENCOUNTER — Ambulatory Visit (INDEPENDENT_AMBULATORY_CARE_PROVIDER_SITE_OTHER): Payer: 59 | Admitting: Family Medicine

## 2015-04-21 VITALS — BP 97/64 | HR 95 | Wt 199.8 lb

## 2015-04-21 DIAGNOSIS — O24119 Pre-existing diabetes mellitus, type 2, in pregnancy, unspecified trimester: Secondary | ICD-10-CM

## 2015-04-21 DIAGNOSIS — E119 Type 2 diabetes mellitus without complications: Secondary | ICD-10-CM

## 2015-04-21 LAB — POCT URINALYSIS DIP (DEVICE)
BILIRUBIN URINE: NEGATIVE
Glucose, UA: NEGATIVE mg/dL
HGB URINE DIPSTICK: NEGATIVE
Ketones, ur: NEGATIVE mg/dL
Nitrite: NEGATIVE
Protein, ur: NEGATIVE mg/dL
Specific Gravity, Urine: 1.02 (ref 1.005–1.030)
Urobilinogen, UA: 0.2 mg/dL (ref 0.0–1.0)
pH: 5.5 (ref 5.0–8.0)

## 2015-04-21 MED ORDER — GLYBURIDE 2.5 MG PO TABS: 2.5000 mg | ORAL_TABLET | Freq: Two times a day (BID) | ORAL | Status: DC

## 2015-04-21 NOTE — Telephone Encounter (Signed)
Natalie Morales called and left a message she needs to clarify what the doctor said about increasing the dosage of her glyburide.  Per chart review Dr. Denyce RobertStinson's note states to increase to 2.5mg  bid instead of daily.   Called Natalie Morales and notified her of Dr. Denyce RobertStinson's orders to increase to 2.5 mg daily- she was confused about dose she was taking- I had her go and get her bottle and we clarified she has been taking one whole 2.5 mg daily at bedtime. We discussed he wants her postprandial cbg's and fasting to be in better control. She states she only has 11 left and will need new rx to get enough pills for bid. I informed her I will send new rx to her pharmacy. I reminded her to be sure and eat meals and snacks as instructed and to call us if any other issues.

## 2015-04-21 NOTE — Patient Instructions (Signed)
Third Trimester of Pregnancy The third trimester is from week 29 through week 42, months 7 through 9. The third trimester is a time when the fetus is growing rapidly. At the end of the ninth month, the fetus is about 20 inches in length and weighs 6-10 pounds.  BODY CHANGES Your body goes through many changes during pregnancy. The changes vary from woman to woman.   Your weight will continue to increase. You can expect to gain 25-35 pounds (11-16 kg) by the end of the pregnancy.  You may begin to get stretch marks on your hips, abdomen, and breasts.  You may urinate more often because the fetus is moving lower into your pelvis and pressing on your bladder.  You may develop or continue to have heartburn as a result of your pregnancy.  You may develop constipation because certain hormones are causing the muscles that push waste through your intestines to slow down.  You may develop hemorrhoids or swollen, bulging veins (varicose veins).  You may have pelvic pain because of the weight gain and pregnancy hormones relaxing your joints between the bones in your pelvis. Backaches may result from overexertion of the muscles supporting your posture.  You may have changes in your hair. These can include thickening of your hair, rapid growth, and changes in texture. Some women also have hair loss during or after pregnancy, or hair that feels dry or thin. Your hair will most likely return to normal after your baby is born.  Your breasts will continue to grow and be tender. A yellow discharge may leak from your breasts called colostrum.  Your belly button may stick out.  You may feel short of breath because of your expanding uterus.  You may notice the fetus "dropping," or moving lower in your abdomen.  You may have a bloody mucus discharge. This usually occurs a few days to a week before labor begins.  Your cervix becomes thin and soft (effaced) near your due date. WHAT TO EXPECT AT YOUR PRENATAL  EXAMS  You will have prenatal exams every 2 weeks until week 36. Then, you will have weekly prenatal exams. During a routine prenatal visit:  You will be weighed to make sure you and the fetus are growing normally.  Your blood pressure is taken.  Your abdomen will be measured to track your baby's growth.  The fetal heartbeat will be listened to.  Any test results from the previous visit will be discussed.  You may have a cervical check near your due date to see if you have effaced. At around 36 weeks, your caregiver will check your cervix. At the same time, your caregiver will also perform a test on the secretions of the vaginal tissue. This test is to determine if a type of bacteria, Group B streptococcus, is present. Your caregiver will explain this further. Your caregiver may ask you:  What your birth plan is.  How you are feeling.  If you are feeling the baby move.  If you have had any abnormal symptoms, such as leaking fluid, bleeding, severe headaches, or abdominal cramping.  If you have any questions. Other tests or screenings that may be performed during your third trimester include:  Blood tests that check for low iron levels (anemia).  Fetal testing to check the health, activity level, and growth of the fetus. Testing is done if you have certain medical conditions or if there are problems during the pregnancy. FALSE LABOR You may feel small, irregular contractions that   eventually go away. These are called Braxton Hicks contractions, or false labor. Contractions may last for hours, days, or even weeks before true labor sets in. If contractions come at regular intervals, intensify, or become painful, it is best to be seen by your caregiver.  SIGNS OF LABOR   Menstrual-like cramps.  Contractions that are 5 minutes apart or less.  Contractions that start on the top of the uterus and spread down to the lower abdomen and back.  A sense of increased pelvic pressure or back  pain.  A watery or bloody mucus discharge that comes from the vagina. If you have any of these signs before the 37th week of pregnancy, call your caregiver right away. You need to go to the hospital to get checked immediately. HOME CARE INSTRUCTIONS   Avoid all smoking, herbs, alcohol, and unprescribed drugs. These chemicals affect the formation and growth of the baby.  Follow your caregiver's instructions regarding medicine use. There are medicines that are either safe or unsafe to take during pregnancy.  Exercise only as directed by your caregiver. Experiencing uterine cramps is a good sign to stop exercising.  Continue to eat regular, healthy meals.  Wear a good support bra for breast tenderness.  Do not use hot tubs, steam rooms, or saunas.  Wear your seat belt at all times when driving.  Avoid raw meat, uncooked cheese, cat litter boxes, and soil used by cats. These carry germs that can cause birth defects in the baby.  Take your prenatal vitamins.  Try taking a stool softener (if your caregiver approves) if you develop constipation. Eat more high-fiber foods, such as fresh vegetables or fruit and whole grains. Drink plenty of fluids to keep your urine clear or pale yellow.  Take warm sitz baths to soothe any pain or discomfort caused by hemorrhoids. Use hemorrhoid cream if your caregiver approves.  If you develop varicose veins, wear support hose. Elevate your feet for 15 minutes, 3-4 times a day. Limit salt in your diet.  Avoid heavy lifting, wear low heal shoes, and practice good posture.  Rest a lot with your legs elevated if you have leg cramps or low back pain.  Visit your dentist if you have not gone during your pregnancy. Use a soft toothbrush to brush your teeth and be gentle when you floss.  A sexual relationship may be continued unless your caregiver directs you otherwise.  Do not travel far distances unless it is absolutely necessary and only with the approval  of your caregiver.  Take prenatal classes to understand, practice, and ask questions about the labor and delivery.  Make a trial run to the hospital.  Pack your hospital bag.  Prepare the baby's nursery.  Continue to go to all your prenatal visits as directed by your caregiver. SEEK MEDICAL CARE IF:  You are unsure if you are in labor or if your water has broken.  You have dizziness.  You have mild pelvic cramps, pelvic pressure, or nagging pain in your abdominal area.  You have persistent nausea, vomiting, or diarrhea.  You have a bad smelling vaginal discharge.  You have pain with urination. SEEK IMMEDIATE MEDICAL CARE IF:   You have a fever.  You are leaking fluid from your vagina.  You have spotting or bleeding from your vagina.  You have severe abdominal cramping or pain.  You have rapid weight loss or gain.  You have shortness of breath with chest pain.  You notice sudden or extreme swelling   of your face, hands, ankles, feet, or legs.  You have not felt your baby move in over an hour.  You have severe headaches that do not go away with medicine.  You have vision changes. Document Released: 11/09/2001 Document Revised: 11/20/2013 Document Reviewed: 01/16/2013 Exit[employer] Patient Information 2015 [employer]. This information is not intended to replace advice given to you by your health care provider. Make sure you discuss any questions you have with your health care provider.  

## 2015-04-21 NOTE — Progress Notes (Signed)
AFI 16.4 - no further need for AFI. 11:21 PP elevated (74-141 5:14 fasting elevated 80-96. Increase glyburide to 2.5mg  twice a day. Stressed snacks between meals. Discussed symptoms of hypoglycemia.

## 2015-04-21 NOTE — Progress Notes (Signed)
AFI done as recommended due to increased AFI on 5/9 - wnl @ 16.4cm today.  Start twice weekly fetal testing @ 32 wks per guideline.

## 2015-04-26 ENCOUNTER — Other Ambulatory Visit: Payer: Self-pay | Admitting: Obstetrics & Gynecology

## 2015-05-05 ENCOUNTER — Ambulatory Visit (INDEPENDENT_AMBULATORY_CARE_PROVIDER_SITE_OTHER): Payer: 59 | Admitting: Family Medicine

## 2015-05-05 VITALS — BP 104/64 | HR 99 | Wt 201.3 lb

## 2015-05-05 DIAGNOSIS — E119 Type 2 diabetes mellitus without complications: Secondary | ICD-10-CM

## 2015-05-05 DIAGNOSIS — O0992 Supervision of high risk pregnancy, unspecified, second trimester: Secondary | ICD-10-CM

## 2015-05-05 DIAGNOSIS — O24119 Pre-existing diabetes mellitus, type 2, in pregnancy, unspecified trimester: Secondary | ICD-10-CM

## 2015-05-05 DIAGNOSIS — O24112 Pre-existing diabetes mellitus, type 2, in pregnancy, second trimester: Secondary | ICD-10-CM

## 2015-05-05 LAB — POCT URINALYSIS DIP (DEVICE)
Bilirubin Urine: NEGATIVE
Glucose, UA: NEGATIVE mg/dL
HGB URINE DIPSTICK: NEGATIVE
KETONES UR: 40 mg/dL — AB
Leukocytes, UA: NEGATIVE
Nitrite: NEGATIVE
PROTEIN: 30 mg/dL — AB
Specific Gravity, Urine: 1.025 (ref 1.005–1.030)
UROBILINOGEN UA: 0.2 mg/dL (ref 0.0–1.0)
pH: 6 (ref 5.0–8.0)

## 2015-05-05 NOTE — Progress Notes (Signed)
US for growth scheduled 6/20.

## 2015-05-05 NOTE — Patient Instructions (Signed)
Gestational Diabetes Mellitus Gestational diabetes mellitus, often simply referred to as gestational diabetes, is a type of diabetes that some women develop during pregnancy. In gestational diabetes, the pancreas does not make enough insulin (a hormone), the cells are less responsive to the insulin that is made (insulin resistance), or both.Normally, insulin moves sugars from food into the tissue cells. The tissue cells use the sugars for energy. The lack of insulin or the lack of normal response to insulin causes excess sugars to build up in the blood instead of going into the tissue cells. As a result, high blood sugar (hyperglycemia) develops. The effect of high sugar (glucose) levels can cause many problems.  RISK FACTORS You have an increased chance of developing gestational diabetes if you have a family history of diabetes and also have one or more of the following risk factors:  A body mass index over 30 (obesity).  A previous pregnancy with gestational diabetes.  An older age at the time of pregnancy. If blood glucose levels are kept in the normal range during pregnancy, women can have a healthy pregnancy. If your blood glucose levels are not well controlled, there may be risks to you, your unborn baby (fetus), your labor and delivery, or your newborn baby.  SYMPTOMS  If symptoms are experienced, they are much like symptoms you would normally expect during pregnancy. The symptoms of gestational diabetes include:   Increased thirst (polydipsia).  Increased urination (polyuria).  Increased urination during the night (nocturia).  Weight loss. This weight loss may be rapid.  Frequent, recurring infections.  Tiredness (fatigue).  Weakness.  Vision changes, such as blurred vision.  Fruity smell to your breath.  Abdominal pain. DIAGNOSIS Diabetes is diagnosed when blood glucose levels are increased. Your blood glucose level may be checked by one or more of the following blood  tests:  A fasting blood glucose test. You will not be allowed to eat for at least 8 hours before a blood sample is taken.  A random blood glucose test. Your blood glucose is checked at any time of the day regardless of when you ate.  A hemoglobin A1c blood glucose test. A hemoglobin A1c test provides information about blood glucose control over the previous 3 months.  An oral glucose tolerance test (OGTT). Your blood glucose is measured after you have not eaten (fasted) for 1-3 hours and then after you drink a glucose-containing beverage. Since the hormones that cause insulin resistance are highest at about 24-28 weeks of a pregnancy, an OGTT is usually performed during that time. If you have risk factors for gestational diabetes, your health care provider may test you for gestational diabetes earlier than 24 weeks of pregnancy. TREATMENT   You will need to take diabetes medicine or insulin daily to keep blood glucose levels in the desired range.  You will need to match insulin dosing with exercise and healthy food choices. The treatment goal is to maintain the before-meal (preprandial), bedtime, and overnight blood glucose level at 60-99 mg/dL during pregnancy. The treatment goal is to further maintain peak after-meal blood sugar (postprandial glucose) level at 100-140 mg/dL. HOME CARE INSTRUCTIONS   Have your hemoglobin A1c level checked twice a year.  Perform daily blood glucose monitoring as directed by your health care provider. It is common to perform frequent blood glucose monitoring.  Monitor urine ketones when you are ill and as directed by your health care provider.  Take your diabetes medicine and insulin as directed by your health care provider   to maintain your blood glucose level in the desired range.  Never run out of diabetes medicine or insulin. It is needed every day.  Adjust insulin based on your intake of carbohydrates. Carbohydrates can raise blood glucose levels but  need to be included in your diet. Carbohydrates provide vitamins, minerals, and fiber which are an essential part of a healthy diet. Carbohydrates are found in fruits, vegetables, whole grains, dairy products, legumes, and foods containing added sugars.  Eat healthy foods. Alternate 3 meals with 3 snacks.  Maintain a healthy weight gain. The usual total expected weight gain varies according to your prepregnancy body mass index (BMI).  Carry a medical alert card or wear your medical alert jewelry.  Carry a 15-gram carbohydrate snack with you at all times to treat low blood glucose (hypoglycemia). Some examples of 15-gram carbohydrate snacks include:  Glucose tablets, 3 or 4.  Glucose gel, 15-gram tube.  Raisins, 2 tablespoons (24 g).  Jelly beans, 6.  Animal crackers, 8.  Fruit juice, regular soda, or low-fat milk, 4 ounces (120 mL).  Gummy treats, 9.  Recognize hypoglycemia. Hypoglycemia during pregnancy occurs with blood glucose levels of 60 mg/dL and below. The risk for hypoglycemia increases when fasting or skipping meals, during or after intense exercise, and during sleep. Hypoglycemia symptoms can include:  Tremors or shakes.  Decreased ability to concentrate.  Sweating.  Increased heart rate.  Headache.  Dry mouth.  Hunger.  Irritability.  Anxiety.  Restless sleep.  Altered speech or coordination.  Confusion.  Treat hypoglycemia promptly. If you are alert and able to safely swallow, follow the 15:15 rule:  Take 15-20 grams of rapid-acting glucose or carbohydrate. Rapid-acting options include glucose gel, glucose tablets, or 4 ounces (120 mL) of fruit juice, regular soda, or low-fat milk.  Check your blood glucose level 15 minutes after taking the glucose.  Take 15-20 grams more of glucose if the repeat blood glucose level is still 70 mg/dL or below.  Eat a meal or snack within 1 hour once blood glucose levels return to normal.  Be alert to polyuria  (excess urination) and polydipsia (excess thirst) which are early signs of hyperglycemia. An early awareness of hyperglycemia allows for prompt treatment. Treat hyperglycemia as directed by your health care provider.  Engage in at least 30 minutes of physical activity a day or as directed by your health care provider. Ten minutes of physical activity timed 30 minutes after each meal is encouraged to control postprandial blood glucose levels.  Adjust your insulin dosing and food intake as needed if you start a new exercise or sport.  Follow your sick-day plan at any time you are unable to eat or drink as usual.  Avoid tobacco and alcohol use.  Keep all follow-up visits as directed by your health care provider.  Follow the advice of your health care provider regarding your prenatal and post-delivery (postpartum) appointments, meal planning, exercise, medicines, vitamins, blood tests, other medical tests, and physical activities.  Perform daily skin and foot care. Examine your skin and feet daily for cuts, bruises, redness, nail problems, bleeding, blisters, or sores.  Brush your teeth and gums at least twice a day and floss at least once a day. Follow up with your dentist regularly.  Schedule an eye exam during the first trimester of your pregnancy or as directed by your health care provider.  Share your diabetes management plan with your workplace or school.  Stay up-to-date with immunizations.  Learn to manage stress.    Obtain ongoing diabetes education and support as needed.  Learn about and consider breastfeeding your baby.  You should have your blood sugar level checked 6-12 weeks after delivery. This is done with an oral glucose tolerance test (OGTT). SEEK MEDICAL CARE IF:   You are unable to eat food or drink fluids for more than 6 hours.  You have nausea and vomiting for more than 6 hours.  You have a blood glucose level of 200 mg/dL and you have ketones in your  urine.  There is a change in mental status.  You develop vision problems.  You have a persistent headache.  You have upper abdominal pain or discomfort.  You develop an additional serious illness.  You have diarrhea for more than 6 hours.  You have been sick or have had a fever for a couple of days and are not getting better. SEEK IMMEDIATE MEDICAL CARE IF:   You have difficulty breathing.  You no longer feel the baby moving.  You are bleeding or have discharge from your vagina.  You start having premature contractions or labor. MAKE SURE YOU:  Understand these instructions.  Will watch your condition.  Will get help right away if you are not doing well or get worse. Document Released: 02/21/2001 Document Revised: 04/01/2014 Document Reviewed: 06/13/2012 [employer] Patient Information 2015 [employer]. This information is not intended to replace advice given to you by your health care provider. Make sure you discuss any questions you have with your health care provider.  Breastfeeding Deciding to breastfeed is one of the best choices you can make for you and your baby. A change in hormones during pregnancy causes your breast tissue to grow and increases the number and size of your milk ducts. These hormones also allow proteins, sugars, and fats from your blood supply to make breast milk in your milk-producing glands. Hormones prevent breast milk from being released before your baby is born as well as prompt milk flow after birth. Once breastfeeding has begun, thoughts of your baby, as well as his or her sucking or crying, can stimulate the release of milk from your milk-producing glands.  BENEFITS OF BREASTFEEDING For Your Baby  Your first milk (colostrum) helps your baby's digestive system function better.   There are antibodies in your milk that help your baby fight off infections.   Your baby has a lower incidence of asthma, allergies, and sudden infant death  syndrome.   The nutrients in breast milk are better for your baby than infant formulas and are designed uniquely for your baby's needs.   Breast milk improves your baby's brain development.   Your baby is less likely to develop other conditions, such as childhood obesity, asthma, or type 2 diabetes mellitus.  For You   Breastfeeding helps to create a very special bond between you and your baby.   Breastfeeding is convenient. Breast milk is always available at the correct temperature and costs nothing.   Breastfeeding helps to burn calories and helps you lose the weight gained during pregnancy.   Breastfeeding makes your uterus contract to its prepregnancy size faster and slows bleeding (lochia) after you give birth.   Breastfeeding helps to lower your risk of developing type 2 diabetes mellitus, osteoporosis, and breast or ovarian cancer later in life. SIGNS THAT YOUR BABY IS HUNGRY Early Signs of Hunger  Increased alertness or activity.  Stretching.  Movement of the head from side to side.  Movement of the head and opening of the   mouth when the corner of the mouth or cheek is stroked (rooting).  Increased sucking sounds, smacking lips, cooing, sighing, or squeaking.  Hand-to-mouth movements.  Increased sucking of fingers or hands. Late Signs of Hunger  Fussing.  Intermittent crying. Extreme Signs of Hunger Signs of extreme hunger will require calming and consoling before your baby will be able to breastfeed successfully. Do not wait for the following signs of extreme hunger to occur before you initiate breastfeeding:   Restlessness.  A loud, strong cry.   Screaming. BREASTFEEDING BASICS Breastfeeding Initiation  Find a comfortable place to sit or lie down, with your neck and back well supported.  Place a pillow or rolled up blanket under your baby to bring him or her to the level of your breast (if you are seated). Nursing pillows are specially designed  to help support your arms and your baby while you breastfeed.  Make sure that your baby's abdomen is facing your abdomen.   Gently massage your breast. With your fingertips, massage from your chest wall toward your nipple in a circular motion. This encourages milk flow. You may need to continue this action during the feeding if your milk flows slowly.  Support your breast with 4 fingers underneath and your thumb above your nipple. Make sure your fingers are well away from your nipple and your baby's mouth.   Stroke your baby's lips gently with your finger or nipple.   When your baby's mouth is open wide enough, quickly bring your baby to your breast, placing your entire nipple and as much of the colored area around your nipple (areola) as possible into your baby's mouth.   More areola should be visible above your baby's upper lip than below the lower lip.   Your baby's tongue should be between his or her lower gum and your breast.   Ensure that your baby's mouth is correctly positioned around your nipple (latched). Your baby's lips should create a seal on your breast and be turned out (everted).  It is common for your baby to suck about 2-3 minutes in order to start the flow of breast milk. Latching Teaching your baby how to latch on to your breast properly is very important. An improper latch can cause nipple pain and decreased milk supply for you and poor weight gain in your baby. Also, if your baby is not latched onto your nipple properly, he or she may swallow some air during feeding. This can make your baby fussy. Burping your baby when you switch breasts during the feeding can help to get rid of the air. However, teaching your baby to latch on properly is still the best way to prevent fussiness from swallowing air while breastfeeding. Signs that your baby has successfully latched on to your nipple:    Silent tugging or silent sucking, without causing you pain.   Swallowing  heard between every 3-4 sucks.    Muscle movement above and in front of his or her ears while sucking.  Signs that your baby has not successfully latched on to nipple:   Sucking sounds or smacking sounds from your baby while breastfeeding.  Nipple pain. If you think your baby has not latched on correctly, slip your finger into the corner of your baby's mouth to break the suction and place it between your baby's gums. Attempt breastfeeding initiation again. Signs of Successful Breastfeeding Signs from your baby:   A gradual decrease in the number of sucks or complete cessation of sucking.     Falling asleep.   Relaxation of his or her body.   Retention of a small amount of milk in his or her mouth.   Letting go of your breast by himself or herself. Signs from you:  Breasts that have increased in firmness, weight, and size 1-3 hours after feeding.   Breasts that are softer immediately after breastfeeding.  Increased milk volume, as well as a change in milk consistency and color by the fifth day of breastfeeding.   Nipples that are not sore, cracked, or bleeding. Signs That Your Baby is Getting Enough Milk  Wetting at least 3 diapers in a 24-hour period. The urine should be clear and pale yellow by age 5 days.  At least 3 stools in a 24-hour period by age 5 days. The stool should be soft and yellow.  At least 3 stools in a 24-hour period by age 7 days. The stool should be seedy and yellow.  No loss of weight greater than 10% of birth weight during the first 3 days of age.  Average weight gain of 4-7 ounces (113-198 g) per week after age 4 days.  Consistent daily weight gain by age 5 days, without weight loss after the age of 2 weeks. After a feeding, your baby may spit up a small amount. This is common. BREASTFEEDING FREQUENCY AND DURATION Frequent feeding will help you make more milk and can prevent sore nipples and breast engorgement. Breastfeed when you feel the  need to reduce the fullness of your breasts or when your baby shows signs of hunger. This is called "breastfeeding on demand." Avoid introducing a pacifier to your baby while you are working to establish breastfeeding (the first 4-6 weeks after your baby is born). After this time you may choose to use a pacifier. Research has shown that pacifier use during the first year of a baby's life decreases the risk of sudden infant death syndrome (SIDS). Allow your baby to feed on each breast as long as he or she wants. Breastfeed until your baby is finished feeding. When your baby unlatches or falls asleep while feeding from the first breast, offer the second breast. Because newborns are often sleepy in the first few weeks of life, you may need to awaken your baby to get him or her to feed. Breastfeeding times will vary from baby to baby. However, the following rules can serve as a guide to help you ensure that your baby is properly fed:  Newborns (babies 4 weeks of age or younger) may breastfeed every 1-3 hours.  Newborns should not go longer than 3 hours during the day or 5 hours during the night without breastfeeding.  You should breastfeed your baby a minimum of 8 times in a 24-hour period until you begin to introduce solid foods to your baby at around 6 months of age. BREAST MILK PUMPING Pumping and storing breast milk allows you to ensure that your baby is exclusively fed your breast milk, even at times when you are unable to breastfeed. This is especially important if you are going back to work while you are still breastfeeding or when you are not able to be present during feedings. Your lactation consultant can give you guidelines on how long it is safe to store breast milk.  A breast pump is a machine that allows you to pump milk from your breast into a sterile bottle. The pumped breast milk can then be stored in a refrigerator or freezer. Some breast pumps are operated by   hand, while others use  electricity. Ask your lactation consultant which type will work best for you. Breast pumps can be purchased, but some hospitals and breastfeeding support groups lease breast pumps on a monthly basis. A lactation consultant can teach you how to hand express breast milk, if you prefer not to use a pump.  CARING FOR YOUR BREASTS WHILE YOU BREASTFEED Nipples can become dry, cracked, and sore while breastfeeding. The following recommendations can help keep your breasts moisturized and healthy:  Avoid using soap on your nipples.   Wear a supportive bra. Although not required, special nursing bras and tank tops are designed to allow access to your breasts for breastfeeding without taking off your entire bra or top. Avoid wearing underwire-style bras or extremely tight bras.  Air dry your nipples for 3-4minutes after each feeding.   Use only cotton bra pads to absorb leaked breast milk. Leaking of breast milk between feedings is normal.   Use lanolin on your nipples after breastfeeding. Lanolin helps to maintain your skin's normal moisture barrier. If you use pure lanolin, you do not need to wash it off before feeding your baby again. Pure lanolin is not toxic to your baby. You may also hand express a few drops of breast milk and gently massage that milk into your nipples and allow the milk to air dry. In the first few weeks after giving birth, some women experience extremely full breasts (engorgement). Engorgement can make your breasts feel heavy, warm, and tender to the touch. Engorgement peaks within 3-5 days after you give birth. The following recommendations can help ease engorgement:  Completely empty your breasts while breastfeeding or pumping. You may want to start by applying warm, moist heat (in the shower or with warm water-soaked hand towels) just before feeding or pumping. This increases circulation and helps the milk flow. If your baby does not completely empty your breasts while  breastfeeding, pump any extra milk after he or she is finished.  Wear a snug bra (nursing or regular) or tank top for 1-2 days to signal your body to slightly decrease milk production.  Apply ice packs to your breasts, unless this is too uncomfortable for you.  Make sure that your baby is latched on and positioned properly while breastfeeding. If engorgement persists after 48 hours of following these recommendations, contact your health care provider or a lactation consultant. OVERALL HEALTH CARE RECOMMENDATIONS WHILE BREASTFEEDING  Eat healthy foods. Alternate between meals and snacks, eating 3 of each per day. Because what you eat affects your breast milk, some of the foods may make your baby more irritable than usual. Avoid eating these foods if you are sure that they are negatively affecting your baby.  Drink milk, fruit juice, and water to satisfy your thirst (about 10 glasses a day).   Rest often, relax, and continue to take your prenatal vitamins to prevent fatigue, stress, and anemia.  Continue breast self-awareness checks.  Avoid chewing and smoking tobacco.  Avoid alcohol and drug use. Some medicines that may be harmful to your baby can pass through breast milk. It is important to ask your health care provider before taking any medicine, including all over-the-counter and prescription medicine as well as vitamin and herbal supplements. It is possible to become pregnant while breastfeeding. If birth control is desired, ask your health care provider about options that will be safe for your baby. SEEK MEDICAL CARE IF:   You feel like you want to stop breastfeeding or have become   frustrated with breastfeeding.  You have painful breasts or nipples.  Your nipples are cracked or bleeding.  Your breasts are red, tender, or warm.  You have a swollen area on either breast.  You have a fever or chills.  You have nausea or vomiting.  You have drainage other than breast milk from  your nipples.  Your breasts do not become full before feedings by the fifth day after you give birth.  You feel sad and depressed.  Your baby is too sleepy to eat well.  Your baby is having trouble sleeping.   Your baby is wetting less than 3 diapers in a 24-hour period.  Your baby has less than 3 stools in a 24-hour period.  Your baby's skin or the white part of his or her eyes becomes yellow.   Your baby is not gaining weight by 5 days of age. SEEK IMMEDIATE MEDICAL CARE IF:   Your baby is overly tired (lethargic) and does not want to wake up and feed.  Your baby develops an unexplained fever. Document Released: 11/15/2005 Document Revised: 11/20/2013 Document Reviewed: 05/09/2013 [employer] Patient Information 2015 [employer]. This information is not intended to replace advice given to you by your health care provider. Make sure you discuss any questions you have with your health care provider.  

## 2015-05-05 NOTE — Progress Notes (Signed)
  Subjective:   Natalie Morales is a 34 y.o. G1P0000 at 4541w1d being seen today for her obstetrical visit.  Patient reports no complaints.   Contractions: Contractions: Not present.   Vaginal Bleeding Vag. Bleeding: None.   Fetal Movement: Movement: Present.   The following portions of the patient's history were reviewed and updated as appropriate: allergies, current medications, past family history, past medical history, past social history, past surgical history and problem list.   Objective:  BP 104/64 mmHg  Pulse 99  Wt 201 lb 4.8 oz (91.309 kg)  LMP 09/22/2014 Fetal Heart Rate: Fetal Heart Rate (bpm): NST  Fetal Movement: Movement: Present  Fetal Presentation: Presentation: Vertex   Abdomen: Soft, gravid, appropriate for gestational age.  Pain/Pressure: Pain/Pressure: Present     Vaginal: Vaginal Bleeding Vag. Bleeding: None   Discharge:  White  Extremities: Edema: Edema: None   Urinalysis: Protein: Urine Protein: 1+ Glucose: Urine Glucose: Negative Results for orders placed or performed in visit on 05/05/15 (from the past 24 hour(s))  POCT urinalysis dip (device)     Status: Abnormal   Collection Time: 05/05/15  7:41 AM  Result Value Ref Range   Glucose, UA NEGATIVE NEGATIVE mg/dL   Bilirubin Urine NEGATIVE NEGATIVE   Ketones, ur 40 (A) NEGATIVE mg/dL   Specific Gravity, Urine 1.025 1.005 - 1.030   Hgb urine dipstick NEGATIVE NEGATIVE   pH 6.0 5.0 - 8.0   Protein, ur 30 (A) NEGATIVE mg/dL   Urobilinogen, UA 0.2 0.0 - 1.0 mg/dL   Nitrite NEGATIVE NEGATIVE   Leukocytes, UA NEGATIVE NEGATIVE   FBS 76-101 (2 out of range) 2 hour pp 61-160 (4 of 11 are out of range)  Assessment and Plan:   Pregnancy:  G1P0000 at 1841w1d  1. Type 2 diabetes mellitus affecting pregnancy, antepartum - Increase exercise especially after evening meal - Amniotic fluid index with NST 2x/wk - US OB Follow Up; Future  2. Supervision of high risk pregnancy in second trimester - continue  standard prenatal care  3. Diabetes mellitus type 2, controlled, without complications  - continue Glyburide at current dosage   Fetal movement precautions reviewed.  Follow up in 1 week.   Reva Boresanya S Pratt, MD

## 2015-05-08 ENCOUNTER — Ambulatory Visit (INDEPENDENT_AMBULATORY_CARE_PROVIDER_SITE_OTHER): Payer: 59 | Admitting: *Deleted

## 2015-05-08 VITALS — BP 116/69 | HR 98

## 2015-05-08 DIAGNOSIS — O24119 Pre-existing diabetes mellitus, type 2, in pregnancy, unspecified trimester: Secondary | ICD-10-CM

## 2015-05-08 NOTE — Progress Notes (Signed)
NST reviewed and reactive.  Makell Cyr L. Harraway-Smith, M.D., FACOG    

## 2015-05-12 ENCOUNTER — Ambulatory Visit (INDEPENDENT_AMBULATORY_CARE_PROVIDER_SITE_OTHER): Payer: 59 | Admitting: Obstetrics & Gynecology

## 2015-05-12 VITALS — BP 106/72 | HR 98 | Wt 200.3 lb

## 2015-05-12 DIAGNOSIS — O403XX1 Polyhydramnios, third trimester, fetus 1: Secondary | ICD-10-CM | POA: Diagnosis not present

## 2015-05-12 DIAGNOSIS — E119 Type 2 diabetes mellitus without complications: Secondary | ICD-10-CM

## 2015-05-12 DIAGNOSIS — O24119 Pre-existing diabetes mellitus, type 2, in pregnancy, unspecified trimester: Secondary | ICD-10-CM | POA: Diagnosis not present

## 2015-05-12 DIAGNOSIS — O0992 Supervision of high risk pregnancy, unspecified, second trimester: Secondary | ICD-10-CM | POA: Diagnosis not present

## 2015-05-12 DIAGNOSIS — Z23 Encounter for immunization: Secondary | ICD-10-CM | POA: Diagnosis not present

## 2015-05-12 LAB — POCT URINALYSIS DIP (DEVICE)
Bilirubin Urine: NEGATIVE
Glucose, UA: NEGATIVE mg/dL
Hgb urine dipstick: NEGATIVE
KETONES UR: 40 mg/dL — AB
Leukocytes, UA: NEGATIVE
Nitrite: NEGATIVE
PH: 5.5 (ref 5.0–8.0)
PROTEIN: NEGATIVE mg/dL
SPECIFIC GRAVITY, URINE: 1.02 (ref 1.005–1.030)
Urobilinogen, UA: 1 mg/dL (ref 0.0–1.0)

## 2015-05-12 MED ORDER — TETANUS-DIPHTH-ACELL PERTUSSIS 5-2.5-18.5 LF-MCG/0.5 IM SUSP
0.5000 mL | Freq: Once | INTRAMUSCULAR | Status: AC
  Administered 2015-05-12: 0.5 mL via INTRAMUSCULAR

## 2015-05-12 NOTE — Patient Instructions (Signed)
Return to clinic for any obstetric concerns or go to MAU for evaluation  

## 2015-05-12 NOTE — Progress Notes (Signed)
Patient without complaints.  Only two abnormal dinner postprandials: 167 and 141. And one abnormal fasting 104. Rest are all within range.  Continue glyburide 2.5 mg po bid, and DM diet Tdap to be given today.  NST performed today was reviewed and was found to be reactive.  AFI slightly elevated at 25.7 cm and SDP was 9.9cm concerning for polyhydramnios.  Will reevaluate next week.  Continue recommended antenatal testing and prenatal care.  No other complaints or concerns.  Preterm labor and fetal movement precautions reviewed.

## 2015-05-12 NOTE — Progress Notes (Signed)
Breastfeeding tip of the week reviewed. Encouraged strict adherance to diet recommendations.

## 2015-05-14 ENCOUNTER — Encounter (HOSPITAL_COMMUNITY): Payer: Self-pay | Admitting: *Deleted

## 2015-05-14 ENCOUNTER — Inpatient Hospital Stay (HOSPITAL_COMMUNITY)
Admission: AD | Admit: 2015-05-14 | Discharge: 2015-05-14 | Disposition: A | Discharge status: Home / Self Care | Payer: 59 | Source: Ambulatory Visit | Attending: Obstetrics and Gynecology | Admitting: Obstetrics and Gynecology

## 2015-05-14 DIAGNOSIS — Z3A33 33 weeks gestation of pregnancy: Secondary | ICD-10-CM | POA: Diagnosis not present

## 2015-05-14 DIAGNOSIS — Z3493 Encounter for supervision of normal pregnancy, unspecified, third trimester: Secondary | ICD-10-CM

## 2015-05-14 DIAGNOSIS — O368130 Decreased fetal movements, third trimester, not applicable or unspecified: Secondary | ICD-10-CM | POA: Insufficient documentation

## 2015-05-14 DIAGNOSIS — O0992 Supervision of high risk pregnancy, unspecified, second trimester: Secondary | ICD-10-CM

## 2015-05-14 DIAGNOSIS — O24119 Pre-existing diabetes mellitus, type 2, in pregnancy, unspecified trimester: Secondary | ICD-10-CM

## 2015-05-14 NOTE — MAU Note (Signed)
Pt. Urine in lab 

## 2015-05-14 NOTE — MAU Note (Signed)
Decreased fetal movement.  Denies pain, no bleeding or leaking

## 2015-05-14 NOTE — Discharge Instructions (Signed)

## 2015-05-14 NOTE — MAU Provider Note (Signed)
History   CSN: [id number]  Arrival date and time: 05/14/15 1340   Chief Complaint  Patient presents with  . Decreased Fetal Movement    HPI  Patient is 34 y.o. G1P0000 [redacted]w[redacted]d here with complaints of decreased fetal movement. Patient states that starting Sunday she's been feeling decreased fetal movement. But today she says she had not felt the fetus at all. Patient checks her own Dopplers at home and says that she noticed fetal heart tones of 109, 113 when usually fetal heart tones are 146s, and 130s when fetus asleep. States occasionally fetus is more active at nights but was not active last night. Also endorses that she can usually take orange juice and that will make fetus move but that also did not work. She presents today due to concern for fetal well-being.   Denies LOF, VB, contractions, vaginal discharge.   OB History    Gravida Para Term Preterm AB TAB SAB Ectopic Multiple Living   1 0 0 0 0 0 0 0 0 0     -HRC for DM2  Past Medical History  Diagnosis Date  . Diabetes mellitus without complication 2015    Past Surgical History  Procedure Laterality Date  . Wisdom tooth extraction    . No past surgeries      Family History  Problem Relation Age of Onset  . Lupus Mother     History  Substance Use Topics  . Smoking status: Never Smoker   . Smokeless tobacco: Never Used  . Alcohol Use: Yes     Comment: Rare, not with preg    Allergies: No Known Allergies  Prescriptions prior to admission  Medication Sig Dispense Refill Last Dose  . aspirin EC 81 MG tablet Take 1 tablet (81 mg total) by mouth daily. Take after 12 weeks for prevention of preeclampssia later in pregnancy 300 tablet 2 05/14/2015 at Unknown time  . glyBURIDE (DIABETA) 2.5 MG tablet Take 1 tablet (2.5 mg total) by mouth 2 (two) times daily with a meal. 60 tablet 3 05/13/2015 at Unknown time  . Prenatal Vit-Fe Fumarate-FA (MULTIVITAMIN-PRENATAL) 27-0.8 MG TABS tablet Take 1 tablet by mouth daily at 12  noon.   05/14/2015 at Unknown time  . ONE TOUCH LANCETS MISC Use 4x a day  During pregnancy- For OneTouch Verio 200 each 3 Taking  . ONETOUCH VERIO test strip TEST 4 TIMES DAILY DUE TO PREGNANCY 120 each 3 Taking    Review of Systems  Constitutional: Negative for fever and chills.  Eyes: Negative for blurred vision and double vision.  Respiratory: Negative for shortness of breath.   Cardiovascular: Negative for chest pain and leg swelling.  Gastrointestinal: Negative for nausea and vomiting.  Genitourinary: Negative for dysuria.  Neurological: Negative for dizziness and headaches.  Also per HPI  Physical Exam  Blood pressure 123/74, pulse 107, temperature 98 F (36.7 C), temperature source Oral, resp. rate 18, last menstrual period 09/22/2014.  Physical Exam  Constitutional: She is oriented to person, place, and time. She appears well-developed and well-nourished. No distress.  HENT:  Head: Normocephalic and atraumatic.  Eyes: EOM are normal.  Cardiovascular: Normal rate, regular rhythm, normal heart sounds and intact distal pulses.   Respiratory: Effort normal and breath sounds normal.  GI: Soft. Bowel sounds are normal. There is no tenderness.  Musculoskeletal: Normal range of motion. She exhibits no edema or tenderness.  Neurological: She is alert and oriented to person, place, and time.  Skin: Skin is warm and dry.  Psychiatric: She has a normal mood and affect.   MAU Course  Procedures - None  MDM: -NST reactive and reassuring -States that since being in MAU feels fetal movement  Assessment and Plan  A: Patient is 34 y.o. G1P0000 [redacted]w[redacted]d reporting decreased fetal movement. NST of infant showing infant reactive with tip by 10 accelerations and no decelerations. No concern about fetal well-being currently.  P: Discharge home - Reviewed findings and my conclusion - fetal kick counts reinforced - preterm labor precautions dicussed - Handout given - Follow-up with OB  provider   Caryl Ada, DO 05/14/2015, 4:16 PM PGY-1, [facility]  OB fellow attestation:  I have seen and examined this patient; I agree with above documentation in the resident's note.   Natalie Morales is a 34 y.o. G1P0000 reporting decreased fetal movement now with good FM. denies LOF, VB, contractions, vaginal discharge.  PE: BP 123/74 mmHg  Pulse 107  Temp(Src) 98 F (36.7 C) (Oral)  Resp 18  LMP 09/22/2014 Gen: calm comfortable, NAD Resp: normal effort, no distress Abd: gravid  ROS, labs, PMH reviewed NST reactive  Plan: - fetal kick counts reinforced,  labor precautions - continue routine follow up in OB clinic, has appt tomorrow  Perry Mount, MD 5:27 PM

## 2015-05-15 ENCOUNTER — Telehealth: Payer: Self-pay | Admitting: Family Medicine

## 2015-05-15 ENCOUNTER — Other Ambulatory Visit: Payer: 59

## 2015-05-15 NOTE — Telephone Encounter (Signed)
Patient forgot her appointment, and she will make sure she keep her appointment for Monday.

## 2015-05-19 ENCOUNTER — Ambulatory Visit (INDEPENDENT_AMBULATORY_CARE_PROVIDER_SITE_OTHER): Payer: 59 | Admitting: Obstetrics & Gynecology

## 2015-05-19 ENCOUNTER — Ambulatory Visit (HOSPITAL_COMMUNITY): Payer: 59

## 2015-05-19 ENCOUNTER — Ambulatory Visit (HOSPITAL_COMMUNITY)
Admission: RE | Admit: 2015-05-19 | Discharge: 2015-05-19 | Disposition: A | Discharge status: Home / Self Care | Payer: 59 | Source: Ambulatory Visit | Attending: Family Medicine | Admitting: Family Medicine

## 2015-05-19 ENCOUNTER — Encounter: Payer: Self-pay | Admitting: Obstetrics & Gynecology

## 2015-05-19 VITALS — BP 113/70 | HR 96 | Wt 199.3 lb

## 2015-05-19 DIAGNOSIS — O24113 Pre-existing diabetes mellitus, type 2, in pregnancy, third trimester: Secondary | ICD-10-CM | POA: Insufficient documentation

## 2015-05-19 DIAGNOSIS — E119 Type 2 diabetes mellitus without complications: Secondary | ICD-10-CM | POA: Insufficient documentation

## 2015-05-19 DIAGNOSIS — Z3A34 34 weeks gestation of pregnancy: Secondary | ICD-10-CM | POA: Diagnosis not present

## 2015-05-19 DIAGNOSIS — O24119 Pre-existing diabetes mellitus, type 2, in pregnancy, unspecified trimester: Secondary | ICD-10-CM

## 2015-05-19 LAB — POCT URINALYSIS DIP (DEVICE)
Bilirubin Urine: NEGATIVE
Glucose, UA: NEGATIVE mg/dL
Hgb urine dipstick: NEGATIVE
Ketones, ur: 40 mg/dL — AB
LEUKOCYTES UA: NEGATIVE
NITRITE: NEGATIVE
PROTEIN: NEGATIVE mg/dL
Specific Gravity, Urine: 1.02 (ref 1.005–1.030)
UROBILINOGEN UA: 1 mg/dL (ref 0.0–1.0)
pH: 5.5 (ref 5.0–8.0)

## 2015-05-19 NOTE — Progress Notes (Signed)
Subjective:  Natalie Morales is a 34 y.o. G1P0000 at [redacted]w[redacted]d being seen today for ongoing prenatal care.  Patient reports leg soreness and wobbly.  No calf tenderness, just achy at times.    Contractions: Irritability.  Vag. Bleeding: None. Movement: Present. Denies leaking of fluid.   The following portions of the patient's history were reviewed and updated as appropriate: allergies, current medications, past family history, past medical history, past social history, past surgical history and problem list.   Objective:   Filed Vitals:   05/19/15 0823  BP: 113/70  Pulse: 96  Weight: 199 lb 4.8 oz (90.402 kg)    Fetal Status:     Movement: Present     General:  Alert, oriented and cooperative. Patient is in no acute distress.  Skin: Skin is warm and dry. No rash noted.   Cardiovascular: Normal heart rate noted  Respiratory: Effort and breath sounds normal, no problems with respiration noted  Abdomen: Soft, gravid, appropriate for gestational age. Pain/Pressure: Present     Vaginal: Vag. Bleeding: None.       Cervix: Not evaluated       Extremities: Normal range of motion.  Edema: None  Mental Status: Normal mood and affect. Normal behavior. Normal judgment and thought content.   Urinalysis: Urine Protein: Negative Urine Glucose: Negative  Assessment and Plan:  Pregnancy: G1P0000 at [redacted]w[redacted]d  1. Type 2 diabetes mellitus affecting pregnancy in third trimester, antepartum -Some fasting elevations (up to 100)--will increase glyburide to 3.75mg  QHS (1 adn 1/2 pills QHS). - Fetal nonstress test--Reactive NST today.  AFI in radiology=18; growth at 71%   Preterm labor symptoms and general obstetric precautions including but not limited to vaginal bleeding, contractions, leaking of fluid and fetal movement were reviewed in detail with the patient.  Please refer to After Visit Summary for other counseling recommendations.   Continue 2x week testing Cultures next visit Induce at 39  weeks.  Lesly Dukes, MD

## 2015-05-19 NOTE — Progress Notes (Signed)
Reviewed tip of week with patient  

## 2015-05-22 ENCOUNTER — Ambulatory Visit (INDEPENDENT_AMBULATORY_CARE_PROVIDER_SITE_OTHER): Payer: 59 | Admitting: General Practice

## 2015-05-22 VITALS — BP 104/66 | HR 93

## 2015-05-22 DIAGNOSIS — E119 Type 2 diabetes mellitus without complications: Secondary | ICD-10-CM | POA: Diagnosis not present

## 2015-05-22 NOTE — Progress Notes (Signed)
NST reviewed and reactive.  

## 2015-05-26 ENCOUNTER — Other Ambulatory Visit: Payer: Self-pay | Admitting: Obstetrics and Gynecology

## 2015-05-26 ENCOUNTER — Ambulatory Visit (INDEPENDENT_AMBULATORY_CARE_PROVIDER_SITE_OTHER): Payer: 59 | Admitting: Obstetrics and Gynecology

## 2015-05-26 ENCOUNTER — Encounter: Payer: Self-pay | Admitting: Obstetrics and Gynecology

## 2015-05-26 VITALS — BP 114/82 | HR 96 | Wt 201.8 lb

## 2015-05-26 DIAGNOSIS — E119 Type 2 diabetes mellitus without complications: Secondary | ICD-10-CM | POA: Diagnosis not present

## 2015-05-26 DIAGNOSIS — O0992 Supervision of high risk pregnancy, unspecified, second trimester: Secondary | ICD-10-CM

## 2015-05-26 DIAGNOSIS — Z113 Encounter for screening for infections with a predominantly sexual mode of transmission: Secondary | ICD-10-CM | POA: Diagnosis not present

## 2015-05-26 DIAGNOSIS — Z118 Encounter for screening for other infectious and parasitic diseases: Secondary | ICD-10-CM

## 2015-05-26 DIAGNOSIS — O24119 Pre-existing diabetes mellitus, type 2, in pregnancy, unspecified trimester: Secondary | ICD-10-CM

## 2015-05-26 DIAGNOSIS — O24113 Pre-existing diabetes mellitus, type 2, in pregnancy, third trimester: Secondary | ICD-10-CM | POA: Diagnosis not present

## 2015-05-26 LAB — POCT URINALYSIS DIP (DEVICE)
Bilirubin Urine: NEGATIVE
Glucose, UA: NEGATIVE mg/dL
Hgb urine dipstick: NEGATIVE
KETONES UR: 40 mg/dL — AB
LEUKOCYTES UA: NEGATIVE
Nitrite: NEGATIVE
PROTEIN: 30 mg/dL — AB
Specific Gravity, Urine: >=1.03 (ref 1.005–1.030)
UROBILINOGEN UA: 1 mg/dL (ref 0.0–1.0)
pH: 6 (ref 5.0–8.0)

## 2015-05-26 LAB — OB RESULTS CONSOLE GBS: STREP GROUP B AG: NEGATIVE

## 2015-05-26 LAB — OB RESULTS CONSOLE GC/CHLAMYDIA
Chlamydia: NEGATIVE
GC PROBE AMP, GENITAL: NEGATIVE

## 2015-05-26 MED ORDER — GLYBURIDE 2.5 MG PO TABS: ORAL_TABLET | ORAL | Status: DC

## 2015-05-26 NOTE — Progress Notes (Signed)
Subjective:  Natalie Morales is a 34 y.o. G1P0000 at 6656w1d being seen today for ongoing prenatal care.  Patient reports no complaints.  Contractions: Not present.  Vag. Bleeding: None. Movement: Present. Denies leaking of fluid.   The following portions of the patient's history were reviewed and updated as appropriate: allergies, current medications, past family history, past medical history, past social history, past surgical history and problem list.   Objective:   Filed Vitals:   05/26/15 0815  BP: 114/82  Pulse: 96  Weight: 201 lb 12.8 oz (91.536 kg)    Fetal Status: Fetal Heart Rate (bpm): NST   Movement: Present  Presentation: Vertex  General:  Alert, oriented and cooperative. Patient is in no acute distress.  Skin: Skin is warm and dry. No rash noted.   Cardiovascular: Normal heart rate noted  Respiratory: Normal respiratory effort, no problems with respiration noted  Abdomen: Soft, gravid, appropriate for gestational age. Pain/Pressure: Present     Vaginal: Vag. Bleeding: None.       Cervix: Not evaluated        Extremities: Normal range of motion.  Edema: None  Mental Status: Normal mood and affect. Normal behavior. Normal judgment and thought content.   Urinalysis: Urine Protein: 1+ Urine Glucose: Negative  Assessment and Plan:  Pregnancy: G1P0000 at 10756w1d  1. Diabetes mellitus type 2, controlled, without complications CBGs reviewed: fasting- 72-99  2 hr pp 73-139 (50% within range). Patient admits to forgetting to take her glyburide sometimes - glyBURIDE (DIABETA) 2.5 MG tablet; Take 1 tablet by mouth @ breakfast and 1.5 tablets by mouth @ dinner  Dispense: 90 tablet; Refill: 3 - Amniotic fluid index with NST  2. Type 2 diabetes mellitus affecting pregnancy, antepartum 05/19/2015 EFW 05/19/2015 2498gm (71%tile) - glyBURIDE (DIABETA) 2.5 MG tablet; Take 1 tablet by mouth @ breakfast and 1.5 tablets by mouth @ dinner  Dispense: 90 tablet; Refill: 3  3. Supervision of  high risk pregnancy in second trimester Cultures collected today   Preterm labor symptoms and general obstetric precautions including but not limited to vaginal bleeding, contractions, leaking of fluid and fetal movement were reviewed in detail with the patient.  Please refer to After Visit Summary for other counseling recommendations.   Return in about 1 week (around 06/02/2015) for 2x/wk as scheduled.   Catalina AntiguaPeggy Haileyann Staiger, MD

## 2015-05-27 LAB — GC/CHLAMYDIA PROBE AMP
CT PROBE, AMP APTIMA: NEGATIVE
GC Probe RNA: NEGATIVE

## 2015-05-28 LAB — CULTURE, BETA STREP (GROUP B ONLY)

## 2015-05-29 ENCOUNTER — Other Ambulatory Visit: Payer: 59

## 2015-06-03 ENCOUNTER — Ambulatory Visit (INDEPENDENT_AMBULATORY_CARE_PROVIDER_SITE_OTHER): Payer: 59 | Admitting: *Deleted

## 2015-06-03 VITALS — BP 110/72 | HR 94

## 2015-06-03 DIAGNOSIS — O24119 Pre-existing diabetes mellitus, type 2, in pregnancy, unspecified trimester: Secondary | ICD-10-CM | POA: Diagnosis not present

## 2015-06-03 LAB — POCT URINALYSIS DIP (DEVICE)
Bilirubin Urine: NEGATIVE
GLUCOSE, UA: NEGATIVE mg/dL
Hgb urine dipstick: NEGATIVE
Ketones, ur: 40 mg/dL — AB
LEUKOCYTES UA: NEGATIVE
NITRITE: NEGATIVE
PH: 5.5 (ref 5.0–8.0)
Protein, ur: 30 mg/dL — AB
Specific Gravity, Urine: >=1.03 (ref 1.005–1.030)
Urobilinogen, UA: 1 mg/dL (ref 0.0–1.0)

## 2015-06-03 NOTE — Progress Notes (Signed)
NST reactive.

## 2015-06-05 ENCOUNTER — Ambulatory Visit (INDEPENDENT_AMBULATORY_CARE_PROVIDER_SITE_OTHER): Payer: 59 | Admitting: *Deleted

## 2015-06-05 VITALS — BP 109/72 | HR 95

## 2015-06-05 DIAGNOSIS — O24119 Pre-existing diabetes mellitus, type 2, in pregnancy, unspecified trimester: Secondary | ICD-10-CM | POA: Diagnosis not present

## 2015-06-05 NOTE — Progress Notes (Signed)
NST reviewed and reactive.  

## 2015-06-09 ENCOUNTER — Encounter: Payer: Self-pay | Admitting: Obstetrics and Gynecology

## 2015-06-09 ENCOUNTER — Ambulatory Visit (INDEPENDENT_AMBULATORY_CARE_PROVIDER_SITE_OTHER): Payer: 59 | Admitting: Obstetrics and Gynecology

## 2015-06-09 VITALS — BP 125/73 | HR 96 | Temp 97.7°F | Wt 203.6 lb

## 2015-06-09 DIAGNOSIS — E119 Type 2 diabetes mellitus without complications: Secondary | ICD-10-CM

## 2015-06-09 DIAGNOSIS — O0992 Supervision of high risk pregnancy, unspecified, second trimester: Secondary | ICD-10-CM

## 2015-06-09 DIAGNOSIS — O24119 Pre-existing diabetes mellitus, type 2, in pregnancy, unspecified trimester: Secondary | ICD-10-CM | POA: Diagnosis not present

## 2015-06-09 LAB — POCT URINALYSIS DIP (DEVICE)
Bilirubin Urine: NEGATIVE
GLUCOSE, UA: NEGATIVE mg/dL
HGB URINE DIPSTICK: NEGATIVE
Ketones, ur: NEGATIVE mg/dL
LEUKOCYTES UA: NEGATIVE
Nitrite: NEGATIVE
Protein, ur: 30 mg/dL — AB
SPECIFIC GRAVITY, URINE: 1.025 (ref 1.005–1.030)
Urobilinogen, UA: 0.2 mg/dL (ref 0.0–1.0)
pH: 5.5 (ref 5.0–8.0)

## 2015-06-09 NOTE — Progress Notes (Signed)
Reviewed tip of week with patient  

## 2015-06-09 NOTE — Progress Notes (Signed)
Ultarsound for growth scheduled for July 22,2016 @ 7:30AM.

## 2015-06-09 NOTE — Progress Notes (Signed)
Subjective:  Natalie Morales is a 34 y.o. G1P0000 at 6627w1d being seen today for ongoing prenatal care.  Patient reports no complaints.  Contractions: Irritability.  Vag. Bleeding: None. Movement: Present. Denies leaking of fluid.   The following portions of the patient's history were reviewed and updated as appropriate: allergies, current medications, past family history, past medical history, past social history, past surgical history and problem list.   Objective:   Filed Vitals:   06/09/15 0748  BP: 125/73  Pulse: 96  Temp: 97.7 F (36.5 C)  Weight: 203 lb 9.6 oz (92.352 kg)    Fetal Status: Fetal Heart Rate (bpm): 131   Movement: Present     General:  Alert, oriented and cooperative. Patient is in no acute distress.  Skin: Skin is warm and dry. No rash noted.   Cardiovascular: Normal heart rate noted  Respiratory: Normal respiratory effort, no problems with respiration noted  Abdomen: Soft, gravid, appropriate for gestational age. Pain/Pressure: Present     Vaginal: Vag. Bleeding: None.       Cervix: Not evaluated        Extremities: Normal range of motion.  Edema: None  Mental Status: Normal mood and affect. Normal behavior. Normal judgment and thought content.   Urinalysis: Urine Protein: 1+ Urine Glucose: Negative  Assessment and Plan:  Pregnancy: G1P0000 at 5927w1d  1. Diabetes mellitus type 2, controlled, without complications Continue glyburide CBGs reviewed and greater majority within range. 2 values of 167 pp and patient noted that she overate on those occasions Follow up growth ultrasound next week NST reviewed and reactive  2. Supervision of high risk pregnancy in second trimester Patient is doing well Induction of labor at 39 weeks  3. Type 2 diabetes mellitus affecting pregnancy, antepartum    Term labor symptoms and general obstetric precautions including but not limited to vaginal bleeding, contractions, leaking of fluid and fetal movement were reviewed  in detail with the patient.  Please refer to After Visit Summary for other counseling recommendations.   Return in about 1 week (around 06/16/2015).   Catalina AntiguaPeggy Khalis Hittle, MD

## 2015-06-12 ENCOUNTER — Ambulatory Visit (INDEPENDENT_AMBULATORY_CARE_PROVIDER_SITE_OTHER): Payer: 59 | Admitting: *Deleted

## 2015-06-12 VITALS — BP 118/75 | HR 101

## 2015-06-12 DIAGNOSIS — O24119 Pre-existing diabetes mellitus, type 2, in pregnancy, unspecified trimester: Secondary | ICD-10-CM

## 2015-06-12 NOTE — Progress Notes (Signed)
NST reviewed and reactive.  Ommie Degeorge L. Harraway-Smith, M.D., FACOG    

## 2015-06-12 NOTE — Progress Notes (Signed)
IOL scheduled 7/24 @ 0700

## 2015-06-16 ENCOUNTER — Encounter (HOSPITAL_COMMUNITY): Payer: Self-pay | Admitting: *Deleted

## 2015-06-16 ENCOUNTER — Ambulatory Visit (INDEPENDENT_AMBULATORY_CARE_PROVIDER_SITE_OTHER): Payer: 59 | Admitting: Obstetrics and Gynecology

## 2015-06-16 ENCOUNTER — Encounter: Payer: Self-pay | Admitting: Obstetrics and Gynecology

## 2015-06-16 ENCOUNTER — Telehealth (HOSPITAL_COMMUNITY): Payer: Self-pay | Admitting: *Deleted

## 2015-06-16 VITALS — BP 109/76 | HR 97 | Wt 204.8 lb

## 2015-06-16 DIAGNOSIS — O24113 Pre-existing diabetes mellitus, type 2, in pregnancy, third trimester: Secondary | ICD-10-CM

## 2015-06-16 DIAGNOSIS — O0993 Supervision of high risk pregnancy, unspecified, third trimester: Secondary | ICD-10-CM

## 2015-06-16 DIAGNOSIS — E119 Type 2 diabetes mellitus without complications: Secondary | ICD-10-CM

## 2015-06-16 DIAGNOSIS — O0992 Supervision of high risk pregnancy, unspecified, second trimester: Secondary | ICD-10-CM

## 2015-06-16 DIAGNOSIS — O24119 Pre-existing diabetes mellitus, type 2, in pregnancy, unspecified trimester: Secondary | ICD-10-CM

## 2015-06-16 LAB — POCT URINALYSIS DIP (DEVICE)
Bilirubin Urine: NEGATIVE
Glucose, UA: NEGATIVE mg/dL
Hgb urine dipstick: NEGATIVE
Ketones, ur: 40 mg/dL — AB
Leukocytes, UA: NEGATIVE
Nitrite: NEGATIVE
Protein, ur: 30 mg/dL — AB
Specific Gravity, Urine: >=1.03 (ref 1.005–1.030)
Urobilinogen, UA: 0.2 mg/dL (ref 0.0–1.0)
pH: 6 (ref 5.0–8.0)

## 2015-06-16 NOTE — Telephone Encounter (Signed)
Preadmission screen  

## 2015-06-16 NOTE — Progress Notes (Signed)
US for growth & BPP on 7/22.  IOL on 7/24

## 2015-06-16 NOTE — Progress Notes (Signed)
Subjective:  Natalie Morales is a 34 y.o. G1P0000 at 10426w1d being seen today for ongoing prenatal care.  Patient reports no complaints.  Contractions: Irregular.  Vag. Bleeding: None. Movement: Present. Denies leaking of fluid.   The following portions of the patient's history were reviewed and updated as appropriate: allergies, current medications, past family history, past medical history, past social history, past surgical history and problem list.   Objective:   Filed Vitals:   06/16/15 0804  BP: 109/76  Pulse: 97  Weight: 204 lb 12.8 oz (92.897 kg)    Fetal Status: Fetal Heart Rate (bpm): NST   Movement: Present     General:  Alert, oriented and cooperative. Patient is in no acute distress.  Skin: Skin is warm and dry. No rash noted.   Cardiovascular: Normal heart rate noted  Respiratory: Normal respiratory effort, no problems with respiration noted  Abdomen: Soft, gravid, appropriate for gestational age. Pain/Pressure: Present     Vaginal: Vag. Bleeding: None.       Cervix: Not evaluated        Extremities: Normal range of motion.  Edema: None  Mental Status: Normal mood and affect. Normal behavior. Normal judgment and thought content.   Urinalysis:      Assessment and Plan:  Pregnancy: G1P0000 at 4026w1d  1. Type 2 diabetes mellitus affecting pregnancy, antepartum CBGs reviewed and majority within range. 2 fasting 94, 102; 3 pp 174, 135, 210 Patient admits and noted that she deviated from the diet. Will get back on track this last week of pregnancy - Amniotic fluid index with NST  2. Supervision of high risk pregnancy in second trimester NST reviewed and reactive F/U ultrasound 7/22 IOL on 7/24   3. Diabetes mellitus type 2, controlled, without complications   Term labor symptoms and general obstetric precautions including but not limited to vaginal bleeding, contractions, leaking of fluid and fetal movement were reviewed in detail with the patient. Please refer to  After Visit Summary for other counseling recommendations.  Return in about 6 weeks (around 07/28/2015) for PP visit.  IOL on 7/24.   Catalina AntiguaPeggy Lynnelle Mesmer, MD

## 2015-06-17 ENCOUNTER — Telehealth: Payer: Self-pay | Admitting: *Deleted

## 2015-06-17 NOTE — Telephone Encounter (Signed)
Natalie Morales left a message stating she she was speaking to the maternity nurse with her insurance and the nurse advised her to find out if she should still be taking low dose aspirin. Natalie Morales she is being induced Sunday.  Also wants to know pain relief options for Sunday besides epidural.    Natalie Economyalled Natalie Morales after discussing with Dr. Adrian BlackwaterStinson and informed her she does not need to stop the low dose aspirin, but is ok if she does. Also discussed pain relief measures in labor with her. She voices understanding.

## 2015-06-19 ENCOUNTER — Other Ambulatory Visit: Payer: 59

## 2015-06-20 ENCOUNTER — Ambulatory Visit (HOSPITAL_COMMUNITY)
Admission: RE | Admit: 2015-06-20 | Discharge: 2015-06-20 | Disposition: A | Discharge status: Home / Self Care | Payer: 59 | Source: Ambulatory Visit | Attending: Obstetrics and Gynecology | Admitting: Obstetrics and Gynecology

## 2015-06-20 ENCOUNTER — Encounter (HOSPITAL_COMMUNITY): Payer: Self-pay

## 2015-06-20 DIAGNOSIS — O2412 Pre-existing diabetes mellitus, type 2, in childbirth: Principal | ICD-10-CM | POA: Diagnosis present

## 2015-06-20 DIAGNOSIS — Z3A39 39 weeks gestation of pregnancy: Secondary | ICD-10-CM | POA: Diagnosis present

## 2015-06-20 DIAGNOSIS — O0992 Supervision of high risk pregnancy, unspecified, second trimester: Secondary | ICD-10-CM

## 2015-06-20 DIAGNOSIS — O24119 Pre-existing diabetes mellitus, type 2, in pregnancy, unspecified trimester: Secondary | ICD-10-CM

## 2015-06-20 DIAGNOSIS — E119 Type 2 diabetes mellitus without complications: Secondary | ICD-10-CM | POA: Diagnosis present

## 2015-06-20 DIAGNOSIS — Z3A38 38 weeks gestation of pregnancy: Secondary | ICD-10-CM | POA: Insufficient documentation

## 2015-06-22 ENCOUNTER — Inpatient Hospital Stay (HOSPITAL_COMMUNITY)
Admission: RE | Admit: 2015-06-22 | Discharge: 2015-06-25 | DRG: 774 | Disposition: A | Discharge status: Home / Self Care | Payer: 59 | Source: Ambulatory Visit | Attending: Obstetrics & Gynecology | Admitting: Obstetrics & Gynecology

## 2015-06-22 ENCOUNTER — Encounter (HOSPITAL_COMMUNITY): Payer: Self-pay

## 2015-06-22 VITALS — BP 115/76 | HR 89 | Temp 98.1°F | Resp 18 | Ht 64.0 in | Wt 207.0 lb

## 2015-06-22 DIAGNOSIS — Z3A39 39 weeks gestation of pregnancy: Secondary | ICD-10-CM | POA: Diagnosis present

## 2015-06-22 DIAGNOSIS — O0992 Supervision of high risk pregnancy, unspecified, second trimester: Secondary | ICD-10-CM

## 2015-06-22 DIAGNOSIS — O24119 Pre-existing diabetes mellitus, type 2, in pregnancy, unspecified trimester: Secondary | ICD-10-CM

## 2015-06-22 DIAGNOSIS — O2412 Pre-existing diabetes mellitus, type 2, in childbirth: Secondary | ICD-10-CM | POA: Diagnosis present

## 2015-06-22 DIAGNOSIS — E119 Type 2 diabetes mellitus without complications: Secondary | ICD-10-CM | POA: Diagnosis present

## 2015-06-22 LAB — CBC
HCT: 35.4 % — ABNORMAL LOW (ref 36.0–46.0)
Hemoglobin: 12 g/dL (ref 12.0–15.0)
MCH: 28 pg (ref 26.0–34.0)
MCHC: 33.9 g/dL (ref 30.0–36.0)
MCV: 82.5 fL (ref 78.0–100.0)
Platelets: 291 10*3/uL (ref 150–400)
RBC: 4.29 MIL/uL (ref 3.87–5.11)
RDW: 14.8 % (ref 11.5–15.5)
WBC: 10.3 10*3/uL (ref 4.0–10.5)

## 2015-06-22 LAB — TYPE AND SCREEN
ABO/RH(D): B POS
Antibody Screen: NEGATIVE

## 2015-06-22 LAB — GLUCOSE, CAPILLARY: Glucose-Capillary: 71 mg/dL (ref 65–99)

## 2015-06-22 MED ORDER — ACETAMINOPHEN 325 MG PO TABS: 650.0000 mg | ORAL_TABLET | ORAL | Status: DC | PRN

## 2015-06-22 MED ORDER — TERBUTALINE SULFATE 1 MG/ML IJ SOLN: 0.2500 mg | Freq: Once | INTRAMUSCULAR | Status: AC | PRN

## 2015-06-22 MED ORDER — FENTANYL CITRATE (PF) 100 MCG/2ML IJ SOLN: 50.0000 ug | INTRAMUSCULAR | Status: DC | PRN

## 2015-06-22 MED ORDER — OXYTOCIN 40 UNITS IN LACTATED RINGERS INFUSION - SIMPLE MED: 62.5000 mL/h | INTRAVENOUS | Status: DC

## 2015-06-22 MED ORDER — OXYCODONE-ACETAMINOPHEN 5-325 MG PO TABS: 2.0000 | ORAL_TABLET | ORAL | Status: DC | PRN

## 2015-06-22 MED ORDER — MISOPROSTOL 25 MCG QUARTER TABLET
25.0000 ug | ORAL_TABLET | ORAL | Status: DC | PRN
  Administered 2015-06-22 – 2015-06-23 (×2): 25 ug via VAGINAL
  Filled 2015-06-22 (×2): qty 0.25
  Filled 2015-06-22: qty 1

## 2015-06-22 MED ORDER — ONDANSETRON HCL 4 MG/2ML IJ SOLN: 4.0000 mg | Freq: Four times a day (QID) | INTRAMUSCULAR | Status: DC | PRN

## 2015-06-22 MED ORDER — GLYBURIDE 2.5 MG PO TABS
2.5000 mg | ORAL_TABLET | Freq: Two times a day (BID) | ORAL | Status: DC
  Filled 2015-06-22 (×3): qty 1

## 2015-06-22 MED ORDER — CITRIC ACID-SODIUM CITRATE 334-500 MG/5ML PO SOLN: 30.0000 mL | ORAL | Status: DC | PRN

## 2015-06-22 MED ORDER — LACTATED RINGERS IV SOLN
500.0000 mL | INTRAVENOUS | Status: DC | PRN
  Administered 2015-06-23: 1000 mL via INTRAVENOUS
  Administered 2015-06-24: 500 mL via INTRAVENOUS

## 2015-06-22 MED ORDER — ZOLPIDEM TARTRATE 5 MG PO TABS
5.0000 mg | ORAL_TABLET | Freq: Every evening | ORAL | Status: DC | PRN
  Administered 2015-06-23: 5 mg via ORAL
  Filled 2015-06-22: qty 1

## 2015-06-22 MED ORDER — LACTATED RINGERS IV SOLN
INTRAVENOUS | Status: DC
  Administered 2015-06-22 – 2015-06-23 (×2): via INTRAVENOUS

## 2015-06-22 MED ORDER — OXYTOCIN BOLUS FROM INFUSION
500.0000 mL | INTRAVENOUS | Status: DC
  Administered 2015-06-24: 500 mL via INTRAVENOUS

## 2015-06-22 MED ORDER — OXYCODONE-ACETAMINOPHEN 5-325 MG PO TABS: 1.0000 | ORAL_TABLET | ORAL | Status: DC | PRN

## 2015-06-22 MED ORDER — LIDOCAINE HCL (PF) 1 % IJ SOLN
30.0000 mL | INTRAMUSCULAR | Status: DC | PRN
  Administered 2015-06-24: 30 mL via SUBCUTANEOUS
  Filled 2015-06-22: qty 30

## 2015-06-22 NOTE — H&P (Signed)
Natalie Morales is a 34 y.o. female G1P0000 at [redacted]w[redacted]d gestation with an EDD 06/29/2015 by LMP presenting for IOL 2/2 B DM. Maternal Medical History:  Reason for admission: IOL 2/2 B DM    Natalie Morales is a 34 y.o. female G1P0000 at [redacted]w[redacted]d gestation with an EDD 06/29/2015 by LMP presenting for IOL 2/2 B DM. Pre-gestational DM diagnosed 03/2014. DM well controlled on Glyburide. Was on no DM medications prior to pregnancy. Pregnancy has been uncomplicated except for maternal DM.   No abdominal pain, vaginal bleeding, vaginal discharge, or loss of fluids. Not feeling contractions. +FM. Expect vertex presentation.   On 05/26/2015, cervix was 0.5/thick/ballotable/none.   PNC at [facility] initiated at [redacted] weeks gestation.  GBS negative Girl/breast/no contraception  OB History    Gravida Para Term Preterm AB TAB SAB Ectopic Multiple Living       Past Medical History  Diagnosis Date  . Diabetes mellitus without complication 2015    glyburide   Past Surgical History  Procedure Laterality Date  . Wisdom tooth extraction    . No past surgeries     Family History: family history includes Lupus in her mother. Social History:  reports that she has never smoked. She has never used smokeless tobacco. She reports that she drinks alcohol. She reports that she does not use illicit drugs.   Prenatal Transfer Tool  Maternal Diabetes: Yes:  Diabetes Type:  Pre-pregnancy Genetic Screening: Normal Maternal Ultrasounds/Referrals: Normal Fetal Ultrasounds or other Referrals:  Fetal echo Maternal Substance Abuse:  No Significant Maternal Medications:  Meds include: Other: Glyburide Significant Maternal Lab Results:  None Other Comments:  GBS Negative    Clinic [facility] Prenatal Labs  Dating LMP Blood type: B/POS/-- (02/01 0848)   Genetic Screen Quad: normal Antibody:NEG (02/01 0848)  Anatomic [place] Limited views of the heart at 18 weeks, normal at 22 weeks Rubella: 2.85 (02/01 0848)  GTT  Type 2 DM RPR: NON REAC (02/01 0848)   Flu vaccine 12/30/14 HBsAg: NEGATIVE (02/01 0848)   TDaP vaccine 05/12/15  HIV: NONREACTIVE (02/01 0848)   GBS negative  GBS: negative  Contraception Undecided Pap: Normal, negative HRHPV on 04/01/14  Baby Food Breast   Circumcision N/a -Female   Pediatrician Undecied   Support Person Husband        Review of Systems  Constitutional: Negative for fever, chills, malaise/fatigue and diaphoresis.  Respiratory: Negative for cough and shortness of breath.   Cardiovascular: Negative for chest pain, palpitations and leg swelling.  Gastrointestinal: Positive for heartburn. Negative for vomiting, abdominal pain, diarrhea and constipation (last BM yesterday).  Genitourinary: Negative for dysuria, urgency and frequency.  Skin: Negative for rash.  Neurological: Negative for dizziness, loss of consciousness and headaches.  Psychiatric/Behavioral: Negative for depression.      Last menstrual period 09/22/2014. Maternal Exam:  Uterine Assessment: Contraction frequency is irregular.   Abdomen: Patient reports no abdominal tenderness. Fetal presentation: vertex  Introitus: Normal vulva. Normal vagina.  Pelvis: adequate for delivery.   Cervix: Cervix evaluated by digital exam.     Fetal Exam Fetal Monitor Review: Baseline rate: 135-140.  Variability: moderate (6-25 bpm).   Pattern: no decelerations and accelerations present.    Fetal State Assessment: Category I - tracings are normal.     Physical Exam  Constitutional: She is oriented to person, place, and time. She appears well-developed and well-nourished. No distress.  HENT:  Head: Normocephalic and atraumatic.  Eyes: Conjunctivae are normal. No scleral icterus.  Neck: Neck supple.  Cardiovascular: Normal rate, regular rhythm and normal heart sounds.   Respiratory: Effort normal and breath sounds normal. No respiratory distress.  GI: Soft.  Bowel sounds are normal. She exhibits no distension and no mass. There is no tenderness. There is no rebound and no guarding.  gravid  Musculoskeletal: She exhibits no edema.  Lymphadenopathy:    She has no cervical adenopathy.  Neurological: She is alert and oriented to person, place, and time.  Skin: Skin is warm and dry. No rash noted. She is not diaphoretic. No erythema. No pallor.  Psychiatric: She has a normal mood and affect.  Dilation: Fingertip Effacement (%): Thick Presentation: Vertex Exam by:: Violeta Gelinas, RN Prenatal labs: ABO, Rh: B/POS/-- (02/01 0848) Antibody: NEG (02/01 0848) Rubella: 2.85 (02/01 0848) RPR: NON REAC (02/01 0848)  HBsAg: NEGATIVE (02/01 0848)  HIV: NONREACTIVE (02/01 0848)  GBS: Negative (06/27 0000)   Assessment/Plan: 34 y.o. female G1P0000 at [redacted]w[redacted]d gestation with an EDD 06/29/2015 by LMP presenting for IOL 2/2 B DM  Admit, anticipate SVD SVE: FT/thick Cytotec for cervical ripening   GBS negative  Arliss Journey 06/22/2015, 10:20 PM

## 2015-06-22 NOTE — Progress Notes (Signed)
Pt scheduled for induction this am but informed that due to high unit census, induction will be delayed. Denies bleeding or leaking, states baby is moving well. Informed that we will call as soon as a room is available and if she needs to be seen before she can come to [facility]U at any time.  

## 2015-06-23 ENCOUNTER — Inpatient Hospital Stay (HOSPITAL_COMMUNITY): Payer: 59 | Admitting: Anesthesiology

## 2015-06-23 LAB — GLUCOSE, CAPILLARY: GLUCOSE-CAPILLARY: 69 mg/dL (ref 65–99)

## 2015-06-23 LAB — RPR: RPR: NONREACTIVE

## 2015-06-23 LAB — ABO/RH: ABO/RH(D): B POS

## 2015-06-23 MED ORDER — LIDOCAINE-EPINEPHRINE (PF) 2 %-1:200000 IJ SOLN: INTRAMUSCULAR | Status: DC | PRN

## 2015-06-23 MED ORDER — DIPHENHYDRAMINE HCL 50 MG/ML IJ SOLN: 12.5000 mg | INTRAMUSCULAR | Status: DC | PRN

## 2015-06-23 MED ORDER — TERBUTALINE SULFATE 1 MG/ML IJ SOLN: 0.2500 mg | Freq: Once | INTRAMUSCULAR | Status: AC | PRN

## 2015-06-23 MED ORDER — BUPIVACAINE HCL (PF) 0.5 % IJ SOLN
INTRAMUSCULAR | Status: DC | PRN
  Administered 2015-06-23: 2 mL

## 2015-06-23 MED ORDER — ERYTHROMYCIN 5 MG/GM OP OINT
TOPICAL_OINTMENT | OPHTHALMIC | Status: AC
  Filled 2015-06-23: qty 1

## 2015-06-23 MED ORDER — LIDOCAINE HCL (PF) 1 % IJ SOLN
INTRAMUSCULAR | Status: DC | PRN
  Administered 2015-06-23: 4 mL
  Administered 2015-06-23: 6 mL via EPIDURAL

## 2015-06-23 MED ORDER — FENTANYL 2.5 MCG/ML BUPIVACAINE 1/10 % EPIDURAL INFUSION (WH - ANES)
14.0000 mL/h | INTRAMUSCULAR | Status: DC | PRN
Start: 2015-06-23 — End: 2015-06-23

## 2015-06-23 MED ORDER — OXYTOCIN 40 UNITS IN LACTATED RINGERS INFUSION - SIMPLE MED
1.0000 m[IU]/min | INTRAVENOUS | Status: DC
Start: 2015-06-23 — End: 2015-06-24
  Administered 2015-06-23: 1 m[IU]/min via INTRAVENOUS
  Filled 2015-06-23: qty 1000

## 2015-06-23 MED ORDER — LIDOCAINE-EPINEPHRINE (PF) 2 %-1:200000 IJ SOLN
INTRAMUSCULAR | Status: DC | PRN
  Administered 2015-06-23: 2 mL

## 2015-06-23 MED ORDER — FENTANYL 2.5 MCG/ML BUPIVACAINE 1/10 % EPIDURAL INFUSION (WH - ANES)
14.0000 mL/h | INTRAMUSCULAR | Status: DC | PRN
  Administered 2015-06-23 (×2): 14 mL/h via EPIDURAL
  Filled 2015-06-23 (×3): qty 125

## 2015-06-23 MED ORDER — EPHEDRINE 5 MG/ML INJ
10.0000 mg | INTRAVENOUS | Status: DC | PRN
  Filled 2015-06-23: qty 2

## 2015-06-23 MED ORDER — VITAMIN K1 1 MG/0.5ML IJ SOLN
INTRAMUSCULAR | Status: AC
  Filled 2015-06-23: qty 0.5

## 2015-06-23 MED ORDER — PHENYLEPHRINE 40 MCG/ML (10ML) SYRINGE FOR IV PUSH (FOR BLOOD PRESSURE SUPPORT)
80.0000 ug | PREFILLED_SYRINGE | INTRAVENOUS | Status: DC | PRN
  Filled 2015-06-23: qty 20
  Filled 2015-06-23: qty 2
  Filled 2015-06-23: qty 20

## 2015-06-23 NOTE — Progress Notes (Signed)
Natalie Morales is a 34 y.o. G1P0000 at [redacted]w[redacted]d  admitted for Class B diabetic  Subjective:  Doing well. Feeling contractions described as mild. Objective: BP 107/69 mmHg  Pulse 95  Temp(Src) 98.2 F (36.8 C) (Oral)  Resp 18  Ht  (1.626 m)  Wt 93.895 kg (207 lb)  BMI 35.51 kg/m2  LMP 09/22/2014      FHT:  FHR: 140 bpm, variability: moderate,  accelerations:  Present,  decelerations:  Absent UC:   regular, every 1-2 minutes SVE:   Dilation: 4 Effacement (%): 80 Station: 0 Exam by:: Julio Alm, RN My exam 4/80/-1 AROM clear fluid moderate amount Labs: Lab Results  Component Value Date   WBC 10.3 06/22/2015   HGB 12.0 06/22/2015   HCT 35.4* 06/22/2015   MCV 82.5 06/22/2015   PLT 291 06/22/2015    Assessment / Plan: Induction of labor due to Class B Diabetic,  progressing well on pitocin  IUP @ 39+1  Labor: Progressing normally will hold cytotec and AROM  Preeclampsia:   Fetal Wellbeing:  Category I Pain Control:  Epidural, Fentanyl and are planned for pain relief I/D:  GBS negative Anticipated MOD:  NSVD  Jabree Rebert Grissett 06/23/2015, 9:57 AM

## 2015-06-23 NOTE — Anesthesia Preprocedure Evaluation (Signed)
Anesthesia Evaluation  Patient identified by MRN, date of birth, ID band Patient awake    Reviewed: Allergy & Precautions, H&P , Patient's Chart, lab work & pertinent test results  Airway Mallampati: II TM Distance: >3 FB Neck ROM: full    Dental  (+) Teeth Intact   Pulmonary  breath sounds clear to auscultation        Cardiovascular Rhythm:regular Rate:Normal     Neuro/Psych    GI/Hepatic   Endo/Other  diabetes, Gestational  Renal/GU      Musculoskeletal   Abdominal   Peds  Hematology   Anesthesia Other Findings       Reproductive/Obstetrics (+) Pregnancy                           Anesthesia Physical Anesthesia Plan  ASA: II  Anesthesia Plan: Epidural   Post-op Pain Management:    Induction:   Airway Management Planned:   Additional Equipment:   Intra-op Plan:   Post-operative Plan:   Informed Consent: I have reviewed the patients History and Physical, chart, labs and discussed the procedure including the risks, benefits and alternatives for the proposed anesthesia with the patient or authorized representative who has indicated his/her understanding and acceptance.   Dental Advisory Given  Plan Discussed with:   Anesthesia Plan Comments: (Labs checked- platelets confirmed with RN in room. Fetal heart tracing, per RN, reported to be stable enough for sitting procedure. Discussed epidural, and patient consents to the procedure:  included risk of possible headache,backache, failed block, allergic reaction, and nerve injury. This patient was asked if she had any questions or concerns before the procedure started.)        Anesthesia Quick Evaluation  

## 2015-06-23 NOTE — Anesthesia Procedure Notes (Signed)
Epidural Patient location during procedure: OB  Preanesthetic Checklist Completed: patient identified, site marked, surgical consent, pre-op evaluation, timeout performed, IV checked, risks and benefits discussed and monitors and equipment checked  Epidural Patient position: sitting Prep: site prepped and draped and DuraPrep Patient monitoring: continuous pulse ox and blood pressure Approach: midline Injection technique: LOR air  Needle:  Needle type: Tuohy  Needle gauge: 17 G Needle length: 9 cm and 9 Needle insertion depth: 7 cm Catheter type: closed end flexible Catheter size: 19 Gauge Catheter at skin depth: 14 cm Test dose: negative  Assessment Events: blood not aspirated, injection not painful, no injection resistance, negative IV test and no paresthesia  Additional Notes Dosing of Epidural:  1st dose, through catheter .............................................  Xylocaine 40 mg  2nd dose, through catheter, after waiting 3 minutes.........Xylocaine 60 mg    ( 1% Xylo charted as a single dose in Epic Meds for ease of charting; actual dosing was fractionated as above, for saftey's sake)  As each dose occurred, patient was free of IV sx; and patient exhibited no evidence of SA injection.  Patient is more comfortable after epidural dosed. Please see RN's note for documentation of vital signs,and FHR which are stable.  Patient reminded not to try to ambulate with numb legs, and that an RN must be present when she attempts to get up.       

## 2015-06-23 NOTE — Progress Notes (Signed)
Natalie Morales is a 34 y.o. G1P0000 at [redacted]w[redacted]d by LMP admitted for induction of labor due to Diabetes.  Subjective: Resting comfortably with Ambien. Not feeling any contractions-pain is 0/10.   Objective: BP 118/74 mmHg  Pulse 97  Temp(Src) 98.8 F (37.1 C) (Oral)  Resp 20  Ht  (1.626 m)  Wt 93.895 kg (207 lb)  BMI 35.51 kg/m2  LMP 09/22/2014      FHT:  FHR: 155 bpm, variability: moderate,  accelerations:  Present,  decelerations:  Absent UC:   regular, every 3-7 minutes SVE:   Dilation: Fingertip Effacement (%): Thick Exam by:: Violeta Gelinas, RN  Labs: Lab Results  Component Value Date   WBC 10.3 06/22/2015   HGB 12.0 06/22/2015   HCT 35.4* 06/22/2015   MCV 82.5 06/22/2015   PLT 291 06/22/2015    Assessment / Plan: 34 y.o. G1P0000 at [redacted]w[redacted]d IOL 2/2 B DM2. Cytotec (va) x 1 dose.   Labor: Progressing normally Fetal Wellbeing:  Category I Pain Control:  Labor support without medications. Pain is 0/10.  I/D:  n/a Anticipated MOD:  NSVD   Contracting more than 3 times in 10 minutes. Will hold cytotec and observe until activity calms. Is not feeling any contractions- pain is 0/10.   Arliss Journey 06/23/2015, 3:45 AM

## 2015-06-23 NOTE — Progress Notes (Signed)
Patient ID: Natalie Morales, female   DOB: [birth year], 34 y.o.   MRN: [id number] Doing well but feeling a lot of pressure with contractions Has been laboring down for an hour.  Has been in one position, hesitant to move, Turned to right side  Told nurse she wants a C/S due to fear of big baby. Did not mention this to me  Filed Vitals:   06/23/15 1930 06/23/15 2000 06/23/15 2030 06/23/15 2100  BP: 121/80 125/78 137/80 132/83  Pulse: 103 103 107 97  Temp: 98.7 F (37.1 C)     TempSrc: Oral     Resp: Height:      Weight:       FHR reassuring UCs every 2 min  Dilation: 10 Dilation Complete Date: 06/23/15 Dilation Complete Time: 1945 Effacement (%): 100 Cervical Position: Middle Station: 0 Presentation: Vertex Exam by:: Dennis Bast  DIscussed EFW of 8.5 is reasonable to consider SVD DIscussed we can do laboring down for another hour or start pushing when she wants to Pelvis is generous, intraspinous diameter is greater than 10cm Plenty of room posteriorly Arch is flattened  Encouraged her to keep going WIll push when pressure increases Told she can do little pushes for comfort now  Aviva Signs, CNM

## 2015-06-23 NOTE — Progress Notes (Signed)
CNM notified that Pt is contracting more than 5 cxt's in 10 minutes.  FHR-155 bpm, variability-moderate, accel's present, decel's absent.  Pt's VS are stable and states pain 0/10, unaware of cxt's at this time.  Per CNM, will hold cytotec and continue monitoring at this time.

## 2015-06-23 NOTE — Progress Notes (Signed)
Natalie Morales is a 34 y.o. G1P0000 at [redacted]w[redacted]d by  admitted for Class B Diabetic. EFW 8-5  Subjective:  Comfortable with epidural. No complaints Objective: BP 113/81 mmHg  Pulse 80  Temp(Src) 97.9 F (36.6 C) (Oral)  Resp 18  Ht  (1.626 m)  Wt 93.895 kg (207 lb)  BMI 35.51 kg/m2  LMP 09/22/2014      FHT:  Cat 1 UC:   regular, every 2-3 minutes SVE:   Dilation: 5.5 Effacement (%): 80 Station: -1 Exam by:: Dharma Pare, cnm  Labs: Lab Results  Component Value Date   WBC 10.3 06/22/2015   HGB 12.0 06/22/2015   HCT 35.4* 06/22/2015   MCV 82.5 06/22/2015   PLT 291 06/22/2015    Assessment / Plan: Cat 1  Labor: Progressing normally Preeclampsia:   Fetal Wellbeing:  Category I Pain Control:  Epidural I/D:  GBS negative Anticipated MOD:  NSVD  Rainen Vanrossum Grissett 06/23/2015, 12:17 PM

## 2015-06-24 ENCOUNTER — Encounter (HOSPITAL_COMMUNITY): Payer: Self-pay

## 2015-06-24 DIAGNOSIS — Z3A39 39 weeks gestation of pregnancy: Secondary | ICD-10-CM

## 2015-06-24 DIAGNOSIS — O2412 Pre-existing diabetes mellitus, type 2, in childbirth: Secondary | ICD-10-CM

## 2015-06-24 DIAGNOSIS — E119 Type 2 diabetes mellitus without complications: Secondary | ICD-10-CM

## 2015-06-24 MED ORDER — DIPHENHYDRAMINE HCL 25 MG PO CAPS: 25.0000 mg | ORAL_CAPSULE | Freq: Four times a day (QID) | ORAL | Status: DC | PRN

## 2015-06-24 MED ORDER — DIBUCAINE 1 % RE OINT: 1.0000 "application " | TOPICAL_OINTMENT | RECTAL | Status: DC | PRN

## 2015-06-24 MED ORDER — SIMETHICONE 80 MG PO CHEW
80.0000 mg | CHEWABLE_TABLET | ORAL | Status: DC | PRN
Start: 2015-06-24 — End: 2015-06-25

## 2015-06-24 MED ORDER — PRENATAL MULTIVITAMIN CH
1.0000 | ORAL_TABLET | Freq: Every day | ORAL | Status: DC
  Filled 2015-06-24: qty 1

## 2015-06-24 MED ORDER — ACETAMINOPHEN 325 MG PO TABS
650.0000 mg | ORAL_TABLET | ORAL | Status: DC | PRN
  Administered 2015-06-24: 650 mg via ORAL
  Filled 2015-06-24: qty 2

## 2015-06-24 MED ORDER — WITCH HAZEL-GLYCERIN EX PADS: 1.0000 "application " | MEDICATED_PAD | CUTANEOUS | Status: DC | PRN

## 2015-06-24 MED ORDER — LANOLIN HYDROUS EX OINT: TOPICAL_OINTMENT | CUTANEOUS | Status: DC | PRN

## 2015-06-24 MED ORDER — TETANUS-DIPHTH-ACELL PERTUSSIS 5-2.5-18.5 LF-MCG/0.5 IM SUSP: 0.5000 mL | Freq: Once | INTRAMUSCULAR | Status: DC

## 2015-06-24 MED ORDER — ONDANSETRON HCL 4 MG/2ML IJ SOLN: 4.0000 mg | INTRAMUSCULAR | Status: DC | PRN

## 2015-06-24 MED ORDER — IBUPROFEN 600 MG PO TABS
600.0000 mg | ORAL_TABLET | Freq: Four times a day (QID) | ORAL | Status: DC
  Administered 2015-06-24 – 2015-06-25 (×5): 600 mg via ORAL
  Filled 2015-06-24 (×5): qty 1

## 2015-06-24 MED ORDER — ZOLPIDEM TARTRATE 5 MG PO TABS: 5.0000 mg | ORAL_TABLET | Freq: Every evening | ORAL | Status: DC | PRN

## 2015-06-24 MED ORDER — ONDANSETRON HCL 4 MG PO TABS: 4.0000 mg | ORAL_TABLET | ORAL | Status: DC | PRN

## 2015-06-24 MED ORDER — BENZOCAINE-MENTHOL 20-0.5 % EX AERO
1.0000 "application " | INHALATION_SPRAY | CUTANEOUS | Status: DC | PRN
  Administered 2015-06-24: 1 via TOPICAL
  Filled 2015-06-24 (×2): qty 56

## 2015-06-24 MED ORDER — SENNOSIDES-DOCUSATE SODIUM 8.6-50 MG PO TABS
2.0000 | ORAL_TABLET | ORAL | Status: DC
  Filled 2015-06-24 (×2): qty 2

## 2015-06-24 NOTE — Lactation Note (Signed)
This note was copied from the chart of Natalie Bird Tailor. Lactation Consultation Note  Patient Name: Natalie Morales [id number] Date: 06/24/2015 Reason for consult: Initial assessment  With this mom and term baby, now 51 hours old. Mom reports baby sleepy at breast. I assisted mom with latching baby in football hold - mom was very comfortble with this position. She has large breasts and the baby is almost 9 pounds. Mom also has large nipples. The baby was awake, but not opening very wide . I was able to get her latched, but she would not suck. Mom said baby did this earlier also. The baby seems to pull away with a deep latch - probably needs to adjust to her mouth being filed with mom's nipple. I was able to express colostrum, and instructed mom in how to do so. Since the baby would not suck, after the pediatrician examined her, mom was going to do skin to skin. I did teaching from the baby and me book and alctation serivces. Mom knows to call for questions/concerns and help with latching.    Maternal Data Formula Feeding for Exclusion: No Has patient been taught Hand Expression?: Yes Does the patient have breastfeeding experience prior to this delivery?: No  Feeding Feeding Type: Breast Fed  LATCH Score/Interventions Latch: Too sleepy or reluctant, no latch achieved, no sucking elicited. (able to latach baby, but no suckles) Intervention(s): Skin to skin;Teach feeding cues;Waking techniques Intervention(s): Adjust position;Assist with latch;Breast compression  Audible Swallowing: None Intervention(s): Hand expression;Skin to skin  Type of Nipple: Everted at rest and after stimulation (large nipples, they fill baby's mouth, )  Comfort (Breast/Nipple): Soft / non-tender     Hold (Positioning): Assistance needed to correctly position infant at breast and maintain latch. Intervention(s): Breastfeeding basics reviewed;Support Pillows;Position options;Skin to skin  LATCH Score:  5  Lactation Tools Discussed/Used     Consult Status Consult Status: Follow-up Date: 06/25/15 Follow-up type: In-patient    Alfred Levins 06/24/2015, 1:28 PM

## 2015-06-24 NOTE — Progress Notes (Signed)
Patient ID: Natalie Morales, female   DOB: 11/20/1981, 34 y.o.   MRN: [id number] Pushing well to small amount of crowning Filed Vitals:   06/24/15 0145 06/24/15 0200 06/24/15 0215 06/24/15 0245  BP: 113/62 112/64 121/69 118/67  Pulse: 110 118 106 109  Temp:      TempSrc:      Resp: Height:      Weight:       FHR reactive UCs every 2 min  Will continue pushing  Anticipate SVD

## 2015-06-24 NOTE — Anesthesia Postprocedure Evaluation (Signed)
  Anesthesia Post-op Note  Patient: Natalie Morales  Procedure(s) Performed: * No procedures listed *  Patient Location: Mother/Baby  Anesthesia Type:Epidural  Level of Consciousness: awake, alert , oriented and patient cooperative  Airway and Oxygen Therapy: Patient Spontanous Breathing  Post-op Pain: none  Post-op Assessment: Post-op Vital signs reviewed, Patient's Cardiovascular Status Stable, Respiratory Function Stable, Patent Airway, No headache, No backache and Patient able to bend at knees              Post-op Vital Signs: Reviewed and stable  Last Vitals:  Filed Vitals:   06/24/15 0821  BP: 103/57  Pulse: 96  Temp: 36.8 C  Resp: 20    Complications: No apparent anesthesia complications

## 2015-06-25 MED ORDER — IBUPROFEN 600 MG PO TABS: 600.0000 mg | ORAL_TABLET | Freq: Four times a day (QID) | ORAL | Status: AC

## 2015-06-25 MED ORDER — METFORMIN HCL 500 MG PO TABS: 500.0000 mg | ORAL_TABLET | Freq: Every day | ORAL | Status: DC

## 2015-06-25 NOTE — Discharge Summary (Signed)
Obstetric Discharge Summary Reason for Admission: induction of labor Prenatal Procedures: none Intrapartum Procedures: spontaneous vaginal delivery Postpartum Procedures: none Complications-Operative and Postpartum: 2nd degree periurethral laceration  At 12:57 AM a viable female was delivered via Vaginal, Spontaneous Delivery (Presentation: ; Occiput Anterior). Mild shoulder dystocia. APGAR: 7, 9; weight 8 lb 12.2 oz (3975 g).  Placenta status: Intact, Spontaneous. Cord: 3 vessels with the following complications: Knot x1. Cord pH: N/A  Anesthesia: Epidural  Episiotomy: none Lacerations: 2nd degree;Periurethral Suture Repair: vicryl and 4.0 Est. Blood Loss (mL): 200  Mom to postpartum. Baby to Couplet care / Skin to Skin.  Hospital Course:  Active Problems:   Indication for care in labor or delivery   Natalie Morales is a 34 y.o. G1P1001 s/p SVD.  Patient was admitted 7/24 IOL 2/2 BDM.  She has postpartum course that was uncomplicated including no problems with ambulating, PO intake, urination, pain, or bleeding. The pt feels ready to go home and  will be discharged with outpatient follow-up.   Today: No acute events overnight.  Pt denies problems with ambulating, voiding or po intake.  She denies nausea or vomiting.  Pain is well controlled.  She has had flatus. She has not had bowel movement.  Lochia Small.  Plan for birth control is  no method.  Method of Feeding: Breast  Physical Exam:  General: alert and cooperative Lochia: appropriate Uterine Fundus: firm, below umbilicus DVT Evaluation: No evidence of DVT seen on physical exam.  H/H: Lab Results  Component Value Date/Time   HGB 12.0 06/22/2015 10:35 PM   HCT 35.4* 06/22/2015 10:35 PM    Discharge Diagnoses: Term Pregnancy-delivered  Discharge Information: Date: 06/25/2015 Activity: pelvic rest Diet: routine  Medications: PNV, Ibuprofen and metformin Breast feeding:  Yes Condition: stable Instructions:  refer to handout Discharge to: home  Will send patient home with Metformin until she can see her endocrinologist.  Patient did not have PCP prior to admission, I will take over as her PCP.      Medication List    ASK your doctor about these medications        aspirin EC 81 MG tablet  Take 1 tablet (81 mg total) by mouth daily. Take after 12 weeks for prevention of preeclampssia later in pregnancy     glyBURIDE 2.5 MG tablet  Commonly known as:  DIABETA  Take 1 tablet by mouth @ breakfast and 1.5 tablets by mouth @ dinner     multivitamin-prenatal 27-0.8 MG Tabs tablet  Take 1 tablet by mouth daily at 12 noon.     ONE TOUCH LANCETS Misc  Use 4x a day  During pregnancy- For OneTouch Verio     ONETOUCH VERIO test strip  Generic drug:  glucose blood  TEST 4 TIMES DAILY DUE TO PREGNANCY         De Hollingshead ,MD OB Fellow 06/25/2015,8:02 AM  Seen also by me Doing well breastfeeding.  Fundus firm Lochia WNL Ext WNL  Ready for discharge Aviva Signs, CNM

## 2015-06-25 NOTE — Discharge Instructions (Signed)

## 2015-06-25 NOTE — Progress Notes (Signed)
Post Partum Day 1 Subjective: no complaints, up ad lib, voiding and tolerating PO  Objective: Blood pressure 115/76, pulse 89, temperature 98.1 F (36.7 C), temperature source Oral, resp. rate 18, height  (1.626 m), weight 207 lb (93.895 kg), last menstrual period 09/22/2014, SpO2 100 %, unknown if currently breastfeeding.  Physical Exam:  General: alert, cooperative and no distress Lochia: appropriate Uterine Fundus: firm Incision: healing well DVT Evaluation: No evidence of DVT seen on physical exam.   Recent Labs  06/22/15 2235  HGB 12.0  HCT 35.4*    Assessment/Plan: Plan for discharge tomorrow and Breastfeeding   LOS: 3 days   [facility] 06/25/2015, 7:58 AM

## 2015-07-01 ENCOUNTER — Telehealth: Payer: Self-pay | Admitting: *Deleted

## 2015-07-01 NOTE — Telephone Encounter (Addendum)
Pt left message stating that she would like a stronger pain medication. She delivered on 7/26.  8/3  1205  Called pt after consult with Sharen Counter, CNM and informed her of Rx called in to her pharmacy for Tramadol. She should continue taking ibuprofen for her pain and use the tramadol for moderate to severe pain.  If pain persists after Tramadol is gone, she will need evaluation @ [facility].  Pt voiced understanding.  Diane Day RNC

## 2015-07-02 ENCOUNTER — Ambulatory Visit (INDEPENDENT_AMBULATORY_CARE_PROVIDER_SITE_OTHER): Payer: 59 | Admitting: Internal Medicine

## 2015-07-02 ENCOUNTER — Encounter: Payer: Self-pay | Admitting: Internal Medicine

## 2015-07-02 VITALS — BP 128/88 | HR 92 | Temp 98.7°F | Resp 16 | Ht 64.0 in | Wt 193.0 lb

## 2015-07-02 DIAGNOSIS — E119 Type 2 diabetes mellitus without complications: Secondary | ICD-10-CM

## 2015-07-02 LAB — POCT GLYCOSYLATED HEMOGLOBIN (HGB A1C): HEMOGLOBIN A1C: 5.5

## 2015-07-02 MED ORDER — TRAMADOL HCL 50 MG PO TABS: ORAL_TABLET | ORAL | Status: AC

## 2015-07-02 NOTE — Addendum Note (Signed)
Addended by: Threasa Alpha on: 07/02/2015 09:26 AM   Modules accepted: Orders

## 2015-07-02 NOTE — Progress Notes (Signed)
Patient ID: Natalie Morales, female   DOB: 18-Apr-1981, 34 y.o.   MRN: [id number]  HPI: Natalie Morales is a 34 y.o.-year-old female, initially referred by Dr. Audie Box, for management of DM2, dx 03/2014, diet-controlled, without complications. Last visit 34 year ago!  Since last visit, she got pregnant and had a baby 1 week ago. She was on Glyburide 2.5 mg in am and 3.75 mcg before dinner 2x a day and checking 4x a day. On this regimen, sugars were controlled. Since giving birth >> switched Metformin 500 mg in am.   She gained 18 lbs during the pregnancy.  She is trying to increase milk production >> will start Fenugreek.  Last hemoglobin A1c was: Lab Results  Component Value Date   HGBA1C 5.9 07/25/2014   HGBA1C 6.8* 04/16/2014   HGBA1C 6.9* 04/04/2014   Pt was not not checking her sugars since giving birth except this am >> 70. - am: 90-106 >> 70 - 2h after b'fast: 106, 109 >> n/c - lunch: 106 >> n/c - 2h after lunch: 115, 127 >> n/c - dinner: 80-129 >> n/c - 2h after dinner: 113-121 >> n/c - nighttime: 85-108 >> n/c  - no CKD, last BUN/creatinine:  Lab Results  Component Value Date   BUN 8 12/30/2014   CREATININE 0.55 01/01/2015  Last ACR: Microalb, Ur 04/16/2014 0.6  0.0 - 1.9 mg/dL  Creatinine,U 40/98/1191 262.2    Microalb Creat Ratio 04/16/2014 0.2  0.0 - 30.0 mg/g   - last set of lipids: Lab Results  Component Value Date   CHOL 244* 04/04/2014   HDL 62 04/04/2014   LDLCALC 158* 04/04/2014   TRIG 120 04/04/2014   CHOLHDL 3.9 04/04/2014   - last dilated eye exam: 07/08/2014. No DR. Denies blurry vision. - no numbness and tingling in her feet.  I reviewed pt's medications, allergies, PMH, social hx, family hx, and changes were documented in the history of present illness. Otherwise, unchanged from my initial visit note.  ROS: Constitutional: + weight gain and loss (pregnancy), no fatigue, no subjective hyperthermia/hypothermia Eyes: no blurry vision, no  xerophthalmia ENT: no sore throat, no nodules palpated in throat, no dysphagia/odynophagia, no hoarseness Cardiovascular: no CP/SOB/palpitations/+ B leg swelling Respiratory: no cough/no SOB Gastrointestinal: no N/V/D/C/ heartburn Musculoskeletal: no muscle/joint aches Skin: no rashes Neurological: no tremors/numbness/tingling/dizziness  PE: BP 128/88 mmHg  Pulse 92  Temp(Src) 98.7 F (37.1 C) (Oral)  Resp 16  Ht 5\' 4"  (1.626 m)  Wt 193 lb (87.544 kg)  BMI 33.11 kg/m2  SpO2 97% Wt Readings from Last 3 Encounters:  07/02/15 193 lb (87.544 kg)  06/22/15 207 lb (93.895 kg)  06/20/15 207 lb (93.895 kg)   Constitutional: overweight, in NAD Eyes: PERRLA, EOMI, no exophthalmos ENT: moist mucous membranes, no thyromegaly, no cervical lymphadenopathy Cardiovascular: RRR, No MRG, + B nonpitting edema Respiratory: CTA B Gastrointestinal: abdomen soft, NT, ND, BS+ Musculoskeletal: no deformities, strength intact in all 4 Skin: moist, warm, no rashes, + acanthosis nigricans on neck Neurological: no tremor with outstretched hands, DTR normal in all 4  ASSESSMENT: 1. DM2, non-insulin dependent, without complications - s/p healthy pregnancy  - gave birth 05/2015 - was on Glyburide  PLAN:  1. DM2 - Sugars controlled during pregnancy, on Glyburide. No mfetal macrosomia. Now on Metformin but not checking sugars - had 1 check this am: 70. Since she is planning to start fenugreek to increase milk production (this can cause low CBGs) >> will stop metformin for now.  -  for now, I suggested to Patient Instructions  Please stop Metformin.  Please let me know if the sugars are consistently <80 or >180.  Please come back for a follow-up appointment in 3 months.  Please schedule a new eye exam.  - continue checking sugars at different times of the day - check once a day, rotating checks - given more sugar logs  - up to date with yearly eye exams >> needs one soon - will repeat a HbA1c today  >> 5.5% (excellent) - Return to clinic in 3 mo with sugar log

## 2015-07-02 NOTE — Patient Instructions (Signed)
Please stop Metformin.  Please let me know if the sugars are consistently <80 or >180.  Please come back for a follow-up appointment in 3 months.  Please schedule a new eye exam.

## 2015-08-03 ENCOUNTER — Encounter: Payer: Self-pay | Admitting: Internal Medicine

## 2015-08-07 ENCOUNTER — Ambulatory Visit: Payer: 59 | Admitting: Obstetrics & Gynecology

## 2015-08-08 ENCOUNTER — Ambulatory Visit: Payer: 59 | Admitting: Obstetrics and Gynecology

## 2015-09-05 ENCOUNTER — Ambulatory Visit: Payer: 59 | Admitting: Internal Medicine

## 2015-09-08 ENCOUNTER — Telehealth: Payer: Self-pay | Admitting: *Deleted

## 2015-09-08 NOTE — Telephone Encounter (Signed)
Natalie Morales left a message this am stating she was a patient here while pregnant. States she delivered a baby 06/24/15 named Natalie Morales. States while filling out baby's my chart asked for baby's blood type. Wants to know where she can get that and if we can get that.  States we may leave her a message.

## 2015-09-09 NOTE — Telephone Encounter (Addendum)
Called patient, no answer- left message stating we are trying to reach you to return your phone call, please call us back at the clinics  [phone]  Called pt and advised her that our staff is not able to give her the requested medical information of her baby. She can call the health information dept to request this information. Telephone number provided.            Pt voiced understanding.   Diane Day RNC

## 2015-10-01 ENCOUNTER — Ambulatory Visit: Payer: 59 | Admitting: Internal Medicine

## 2016-07-28 ENCOUNTER — Telehealth: Payer: Self-pay

## 2016-07-28 ENCOUNTER — Other Ambulatory Visit (INDEPENDENT_AMBULATORY_CARE_PROVIDER_SITE_OTHER): Payer: 59

## 2016-07-28 ENCOUNTER — Telehealth: Payer: Self-pay | Admitting: Internal Medicine

## 2016-07-28 DIAGNOSIS — E119 Type 2 diabetes mellitus without complications: Secondary | ICD-10-CM | POA: Diagnosis not present

## 2016-07-28 LAB — T3, FREE: T3, Free: 4.5 pg/mL — ABNORMAL HIGH (ref 2.3–4.2)

## 2016-07-28 LAB — TSH: TSH: 0.84 u[IU]/mL (ref 0.35–4.50)

## 2016-07-28 LAB — T4, FREE: Free T4: 0.8 ng/dL (ref 0.60–1.60)

## 2016-07-28 NOTE — Telephone Encounter (Signed)
Called patient and got her scheduled for lab work. Patient requested to come in today.

## 2016-07-28 NOTE — Telephone Encounter (Signed)
Pt calling to let us know that her thyroid is feeling swollen has not been seen in a year, set up for an appt

## 2016-07-28 NOTE — Telephone Encounter (Signed)
She canceled 2 appts including one for tomorrow... She has another one scheduled on 10/25. Let's keep that appointment for now but let's have her coming to the lab for blood blood work: TSH, free T4, free T3, TPO antibodies for now.

## 2016-07-29 ENCOUNTER — Other Ambulatory Visit: Payer: Self-pay | Admitting: Internal Medicine

## 2016-07-29 ENCOUNTER — Encounter: Payer: Self-pay | Admitting: Internal Medicine

## 2016-07-29 ENCOUNTER — Ambulatory Visit: Payer: 59 | Admitting: Internal Medicine

## 2016-07-29 DIAGNOSIS — E049 Nontoxic goiter, unspecified: Secondary | ICD-10-CM

## 2016-07-29 LAB — THYROID PEROXIDASE ANTIBODY: Thyroperoxidase Ab SerPl-aCnc: 1 IU/mL (ref <9)

## 2016-08-09 ENCOUNTER — Ambulatory Visit
Admission: RE | Admit: 2016-08-09 | Discharge: 2016-08-09 | Disposition: A | Discharge status: Home / Self Care | Payer: 59 | Source: Ambulatory Visit | Attending: Internal Medicine | Admitting: Internal Medicine

## 2016-08-11 ENCOUNTER — Encounter: Payer: Self-pay | Admitting: Internal Medicine

## 2016-08-25 ENCOUNTER — Telehealth: Payer: Self-pay

## 2016-08-25 NOTE — Telephone Encounter (Signed)
Called patient to follow up on unread mychart message. Patient had no questions at this time, but I advsied to message us through [facility] or call us if any other questions came about.

## 2016-09-21 IMAGING — US US OB FOLLOW-UP
1 series · 12 of 28 positions shown · non-contrast
Comparison: none

[Series 1: dm, type 2 · 0.23mm/px · 12 of 65 slices shown]
[im 3/65]
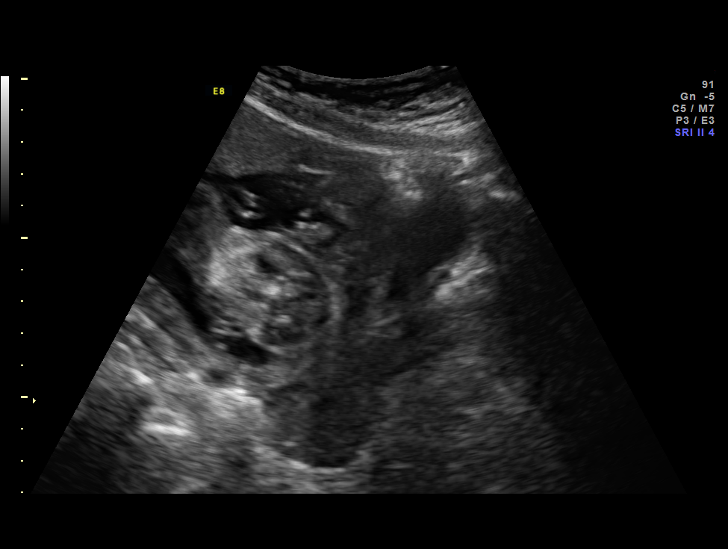
[im 8/65]
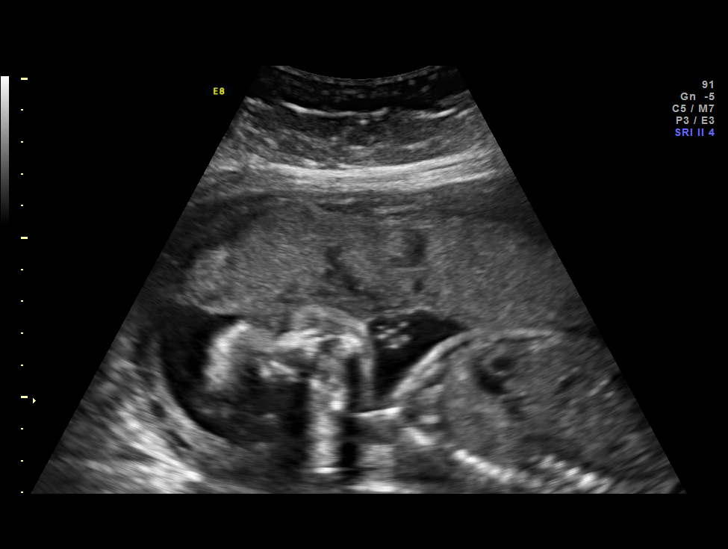
[im 12/65]
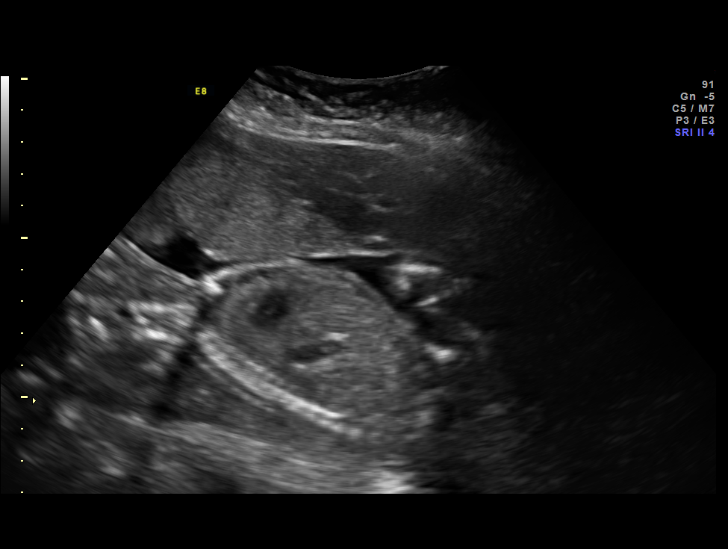
[im 19/65]
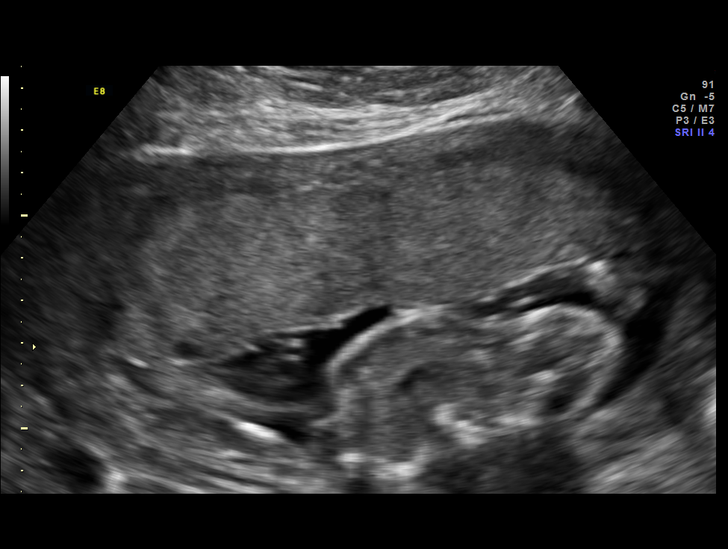
[im 24/65]
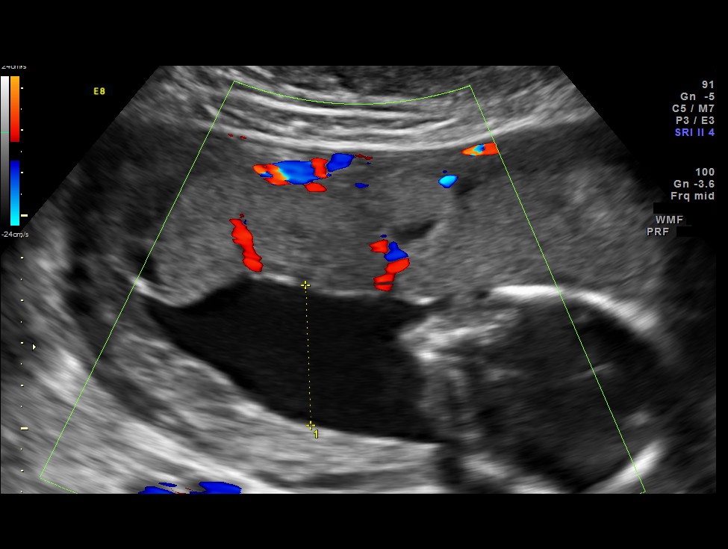
[im 29/65]
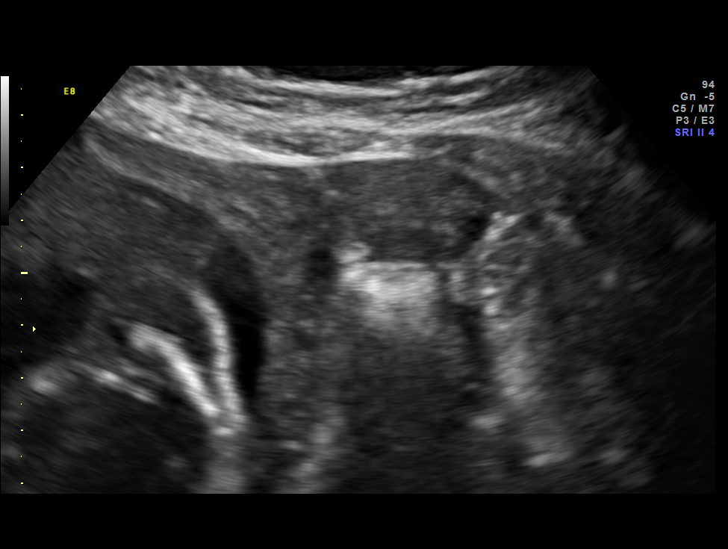
[im 36/65]
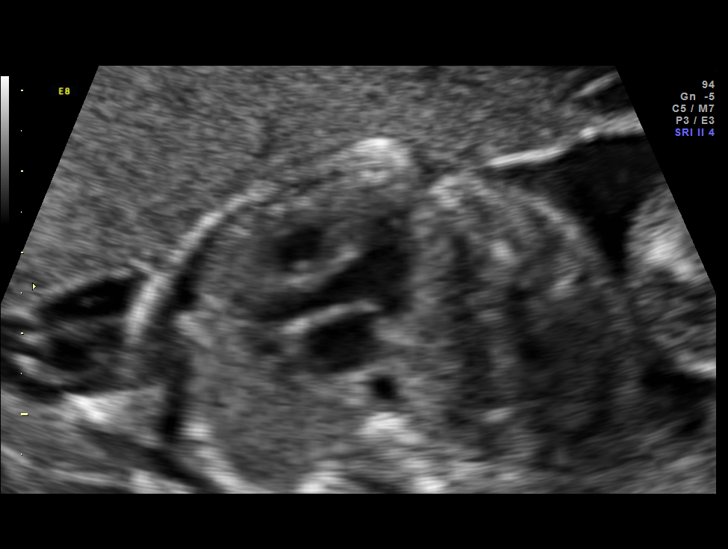
[im 41/65]
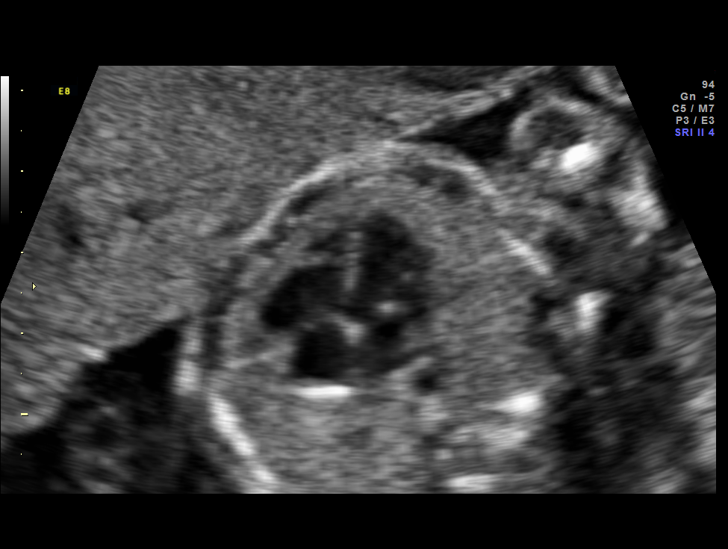
[im 46/65]
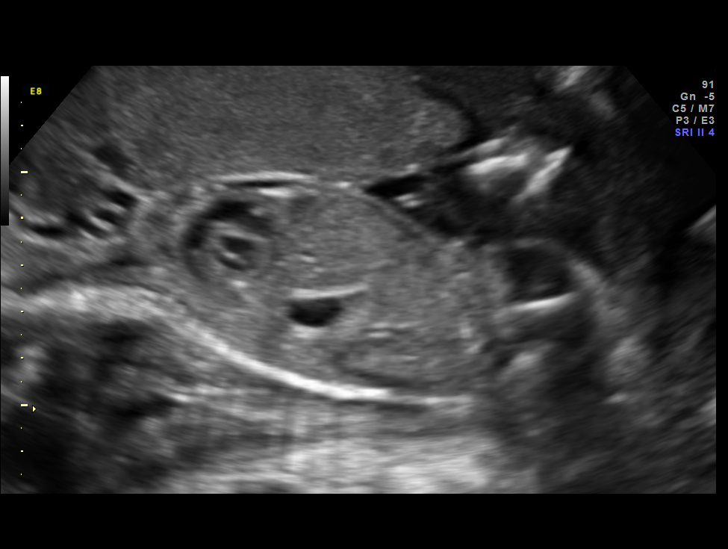
[im 53/65]
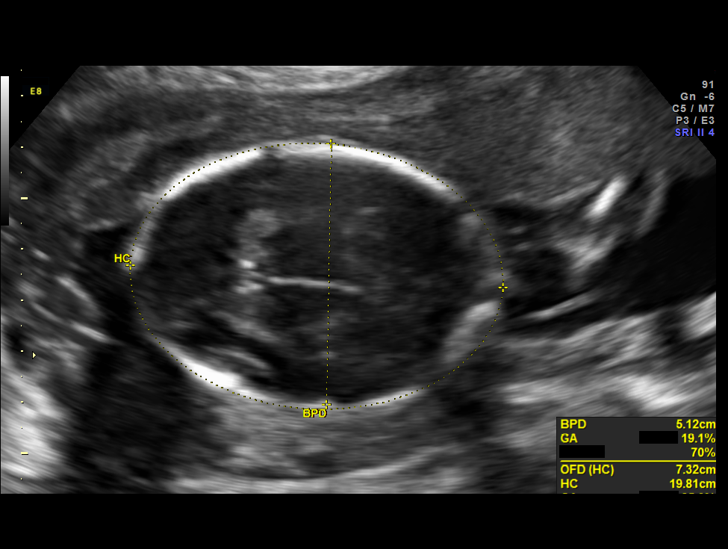
[im 57/65]
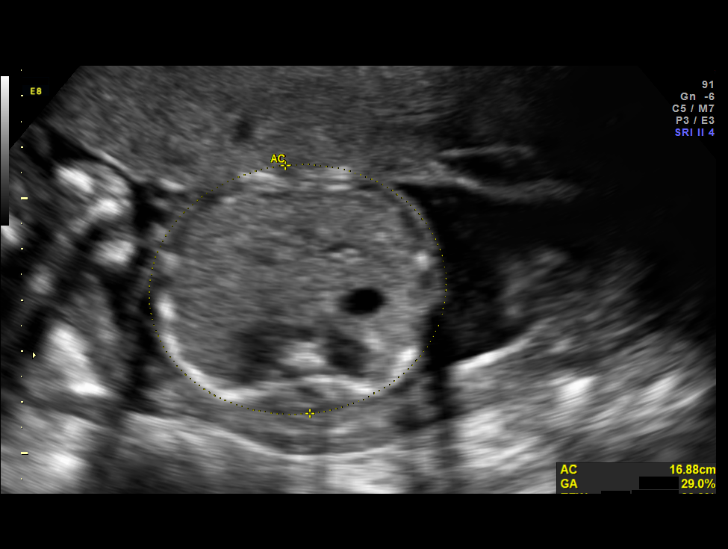
[im 62/65]
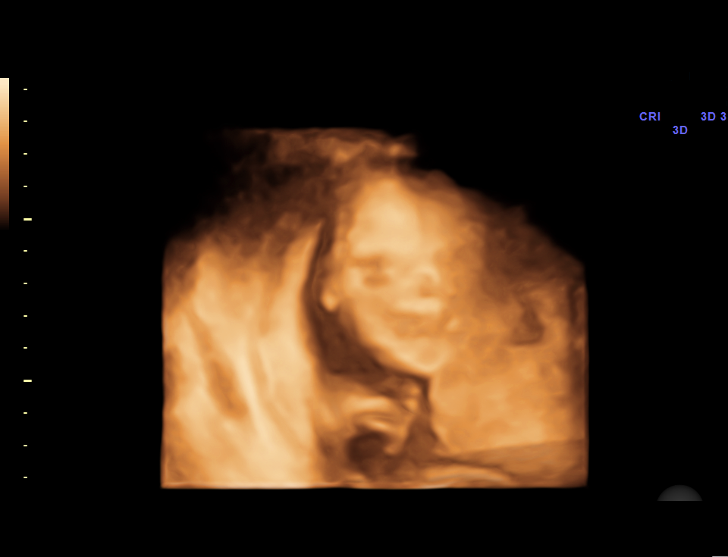

[12 of 28 positions shown; findings below may reference images not displayed]

OBSTETRICS REPORT
                      (Signed Final 02/25/2015 [DATE])

Service(s) Provided

 US OB FOLLOW UP                                       76816.1
Indications

 22 weeks gestation of pregnancy
 Pre-existing diabetes, type 2, in pregnancy, second
 trimester(Diet controlled)
 Quad Screen-Negative
 Follow-up incomplete fetal anatomic evaluation        Z36
Fetal Evaluation

 Num Of Fetuses:    1
 Fetal Heart Rate:  145                          bpm
 Cardiac Activity:  Observed
 Presentation:      Breech
 Placenta:          Anterior, above cervical os
 P. Cord            Visualized, central
 Insertion:

 Amniotic Fluid
 AFI FV:      Subjectively within normal limits
                                             Larg Pckt:     3.3  cm
Biometry

 BPD:     50.8  mm     G. Age:  21w 3d                CI:         69.6   70 - 86
 OFD:       73  mm                                    FL/HC:      18.4   18.4 -

 HC:     196.7  mm     G. Age:  21w 6d       22  %    HC/AC:      1.16   1.06 -

 AC:     170.2  mm     G. Age:  22w 0d       33  %    FL/BPD:     71.3   71 - 87
 FL:      36.2  mm     G. Age:  21w 3d       17  %    FL/AC:      21.3   20 - 24
 HUM:     35.5  mm     G. Age:  22w 2d       46  %

 Est. FW:     449  gm           1 lb     34  %
Gestational Age

 LMP:           22w 2d        Date:  09/22/14                 EDD:   06/29/15
 U/S Today:     21w 5d                                        EDD:   07/03/15
 Best:          22w 2d     Det. By:  LMP  (09/22/14)          EDD:   06/29/15
Anatomy
 Cranium:          Appears normal         Aortic Arch:      Previously seen
 Fetal Cavum:      Previously seen        Ductal Arch:      Previously seen
 Ventricles:       Appears normal         Diaphragm:        Appears normal
 Choroid Plexus:   Previously seen        Stomach:          Appears normal, left
                                                            sided
 Cerebellum:       Previously seen        Abdomen:          Previously seen
 Posterior Fossa:  Previously seen        Abdominal Wall:   Previously seen
 Nuchal Fold:      Previously seen        Cord Vessels:     Previously seen
 Face:             Orbits and profile     Kidneys:          Appear normal
                   previously seen
 Lips:             Previously seen        Bladder:          Appears normal
 Heart:            Appears normal         Spine:            Previously seen
                   (4CH, axis, and
                   situs)
 RVOT:             Appears normal         Lower             Previously seen
                                          Extremities:
 LVOT:             Appears normal         Upper             Previously seen
                                          Extremities:

 Other:  Female gender. Heels and 5th digit previously visualized. Technically
         difficult due to fetal position.
Cervix Uterus Adnexa

 Cervical Length:    2.9      cm

 Cervix:       Normal appearance by transabdominal scan.
 Uterus:       No abnormality visualized.
 Cul De Sac:   No free fluid seen.

 Left Ovary:    Within normal limits.
 Right Ovary:   Within normal limits.

 Adnexa:     No abnormality visualized.
Impression

 Singleton intrauterine pregnancy at 22 weeks 2 days
 gestation with fetal cardiac activity
 No apparent birth defect on completion of fetal anatomic
 survey today
 Normal appearing fetal growth and amniotic fluid volume
 Breech presentation
Recommendations

 Follow-up ultrasounds as clinically indicated.

 questions or concerns.
                  Zinner, Jaeeun

## 2016-09-22 ENCOUNTER — Ambulatory Visit: Payer: 59 | Admitting: Internal Medicine

## 2016-12-13 IMAGING — US US OB FOLLOW-UP
1 series · 12 of 28 positions shown · non-contrast
Comparison: none

OBSTETRICS REPORT
(Signed Final 05/19/2015 [DATE])

Date:
Service(s) Provided
US OB FOLLOW UP                                        76816.1
Indications
34 weeks gestation of pregnancy
Pre-existing diabetes, type 2, in pregnancy,
second trimester (Diet controlled)
Fetal Evaluation
Num Of             1
Fetuses:
Fetal Heart        126                          bpm
Rate:
Cardiac Activity:  Observed
Presentation:      Cephalic
Placenta:          Anterior, above cervical
os
P. Cord            Previously Visualized
Insertion:
Amniotic Fluid
AFI FV:      Subjectively within normal limits
AFI Sum:     18.42    cm      68  %Tile     Larg Pckt:        8  cm
RUQ:   8       cm    RLQ:   3.17    cm   LUQ:    3.56    cm   LLQ:    3.69   cm
Biometry
BPD:     85.7   m    G. Age:   34w 4d                 CI:         81.3   70 - 86
m
OFD:    105.4   m                                     FL/HC:      21.1   19.4 -
HC:     306.7   m    G. Age:   34w 1d        18  %    HC/AC:      0.97   0.96 -
AC:       316   m    G. Age:   35w 4d        87  %    FL/BPD      75.5   71 - 87
m                                     :
FL:      64.7   m    G. Age:   33w 3d        22  %    FL/AC:      20.5   20 - 24
HUM:     57.4   m    G. Age:   33w 2d        42  %
Est.        5750   gm    5 lb 8 oz      71   %
FW:
Gestational Age
LMP:           34w 1d        Date:  09/22/14                  EDD:   06/29/15
U/S Today:     34w 3d                                         EDD:   06/27/15
Best:          34w 1d    Det. By:   LMP  (09/22/14)           EDD:   06/29/15
Anatomy
Cranium:          Appears normal         Aortic Arch:       Previously seen
Fetal Cavum:      Appears normal         Ductal Arch:       Previously seen
Ventricles:       Appears normal         Diaphragm:         Previously seen
Choroid Plexus:   Previously seen        Stomach:           Appears normal,
left sided
Cerebellum:       Previously seen        Abdomen:           Appears normal
Posterior         Previously seen        Abdominal          Previously seen
Fossa:                                   Wall:
Nuchal Fold:      Previously seen        Cord Vessels:      Previously seen
Face:             Orbits and profile     Kidneys:           Appear normal
previously seen
Lips:             Previously seen        Bladder:           Appears normal
Heart:            Previously seen        Spine:             Previously seen
RVOT:             Previously seen        Lower              Previously seen
Extremities:
LVOT:             Previously seen        Upper              Previously seen
Other:   Female gender previously seen. Heels and 5th digit previously
visualized. Technically difficult due to advanced GA and fetal
position.
Cervix Uterus Adnexa
Cervix:       Not visualized (advanced GA >36wks)
Left Ovary:    Within normal limits.
Right Ovary:   Within normal limits.
Adnexa:     No abnormality visualized.
Impression
INDICATION: 33 yr old G1P0 at 13w9d with type II diabetes for
fetal growth. Remote read.

[Series 1: us ob follow-up · 12 of 31 slices shown]
[im 2/31]
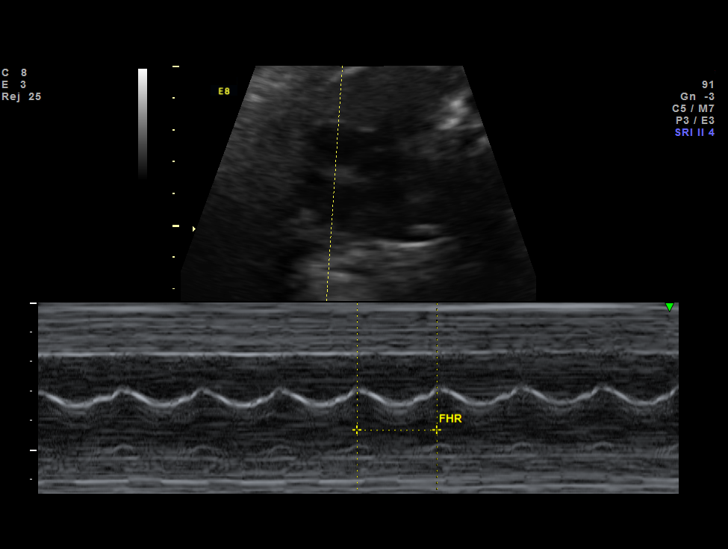
[im 4/31]
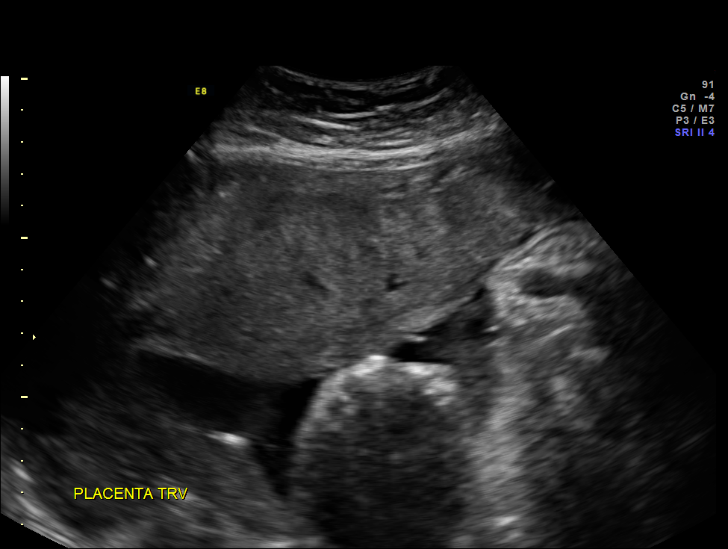
[im 6/31]
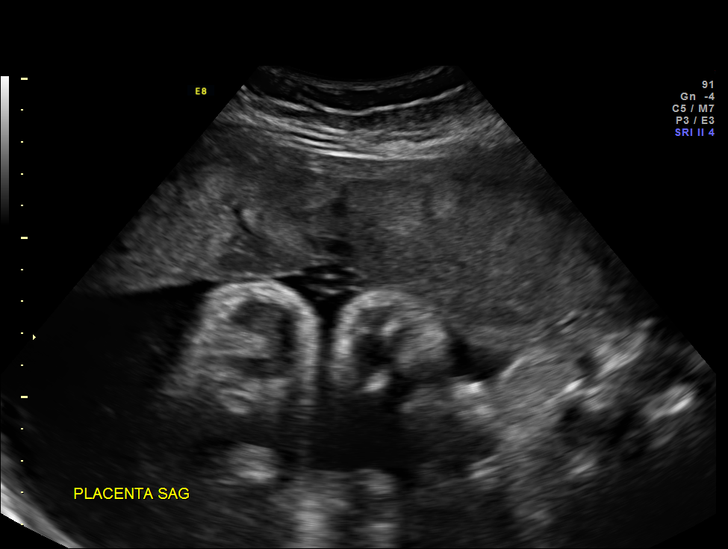
[im 9/31]
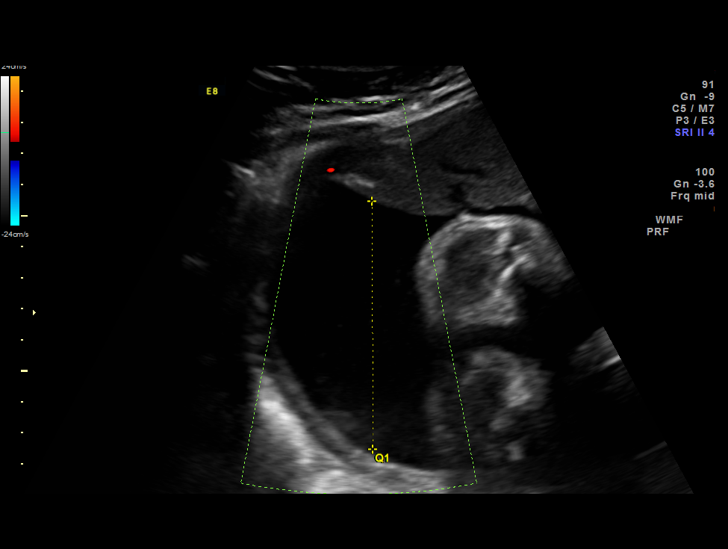
[im 12/31]
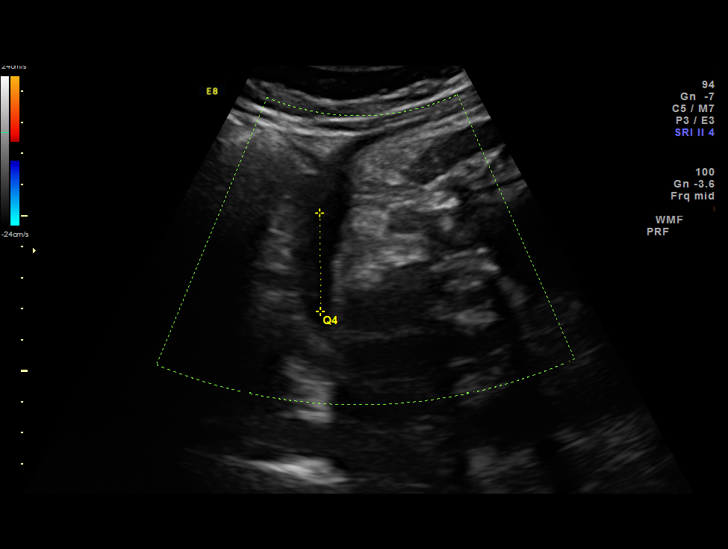
[im 14/31]
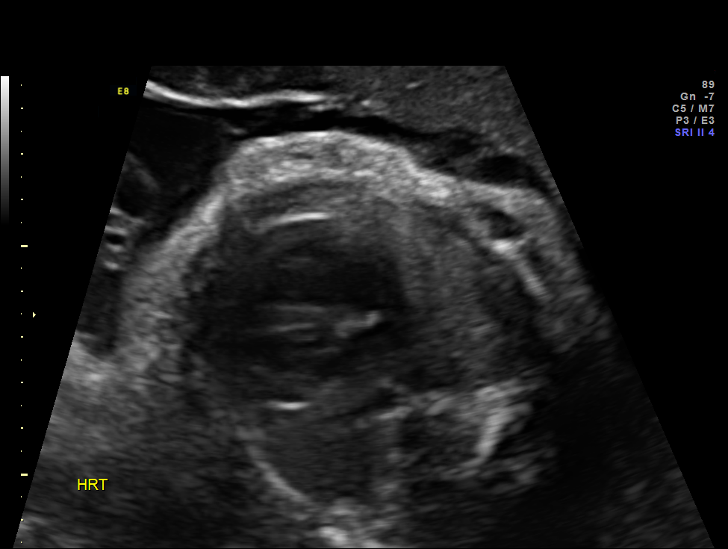
[im 17/31]
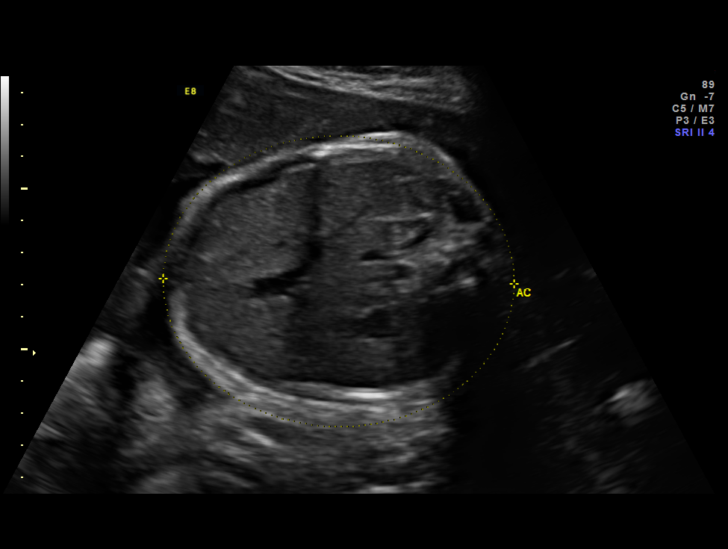
[im 19/31]
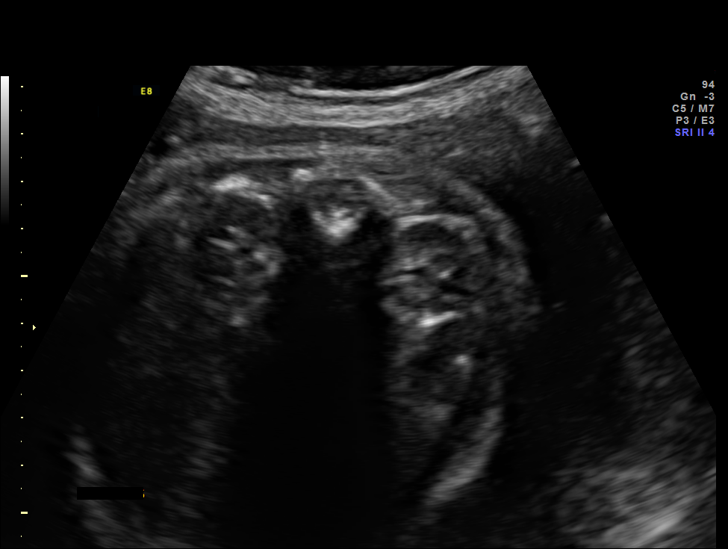
[im 22/31]
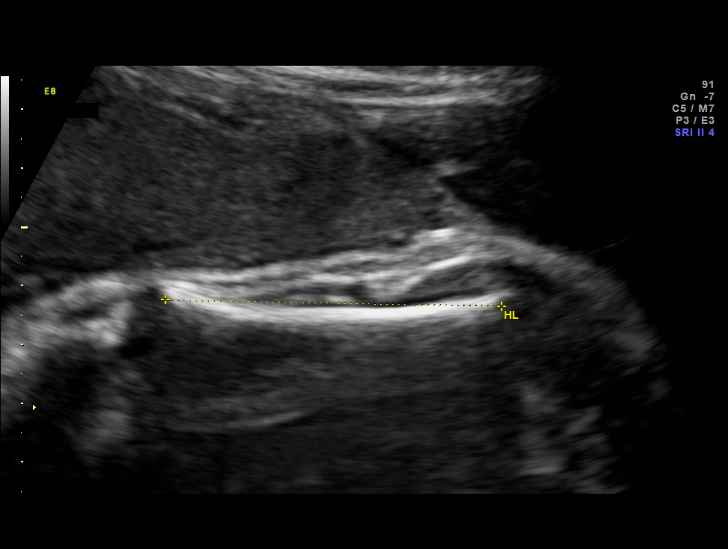
[im 25/31]
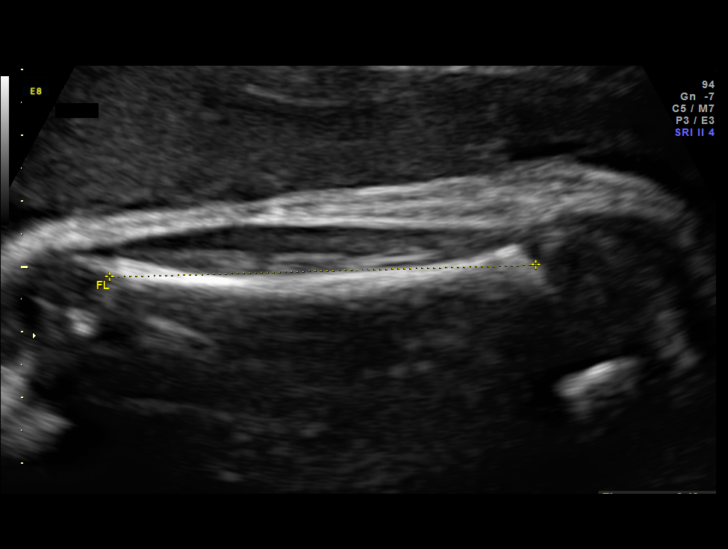
[im 27/31]
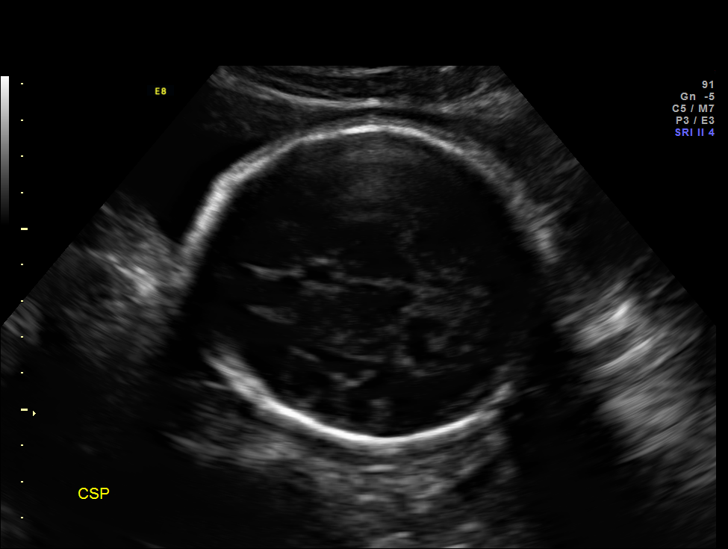
[im 29/31]
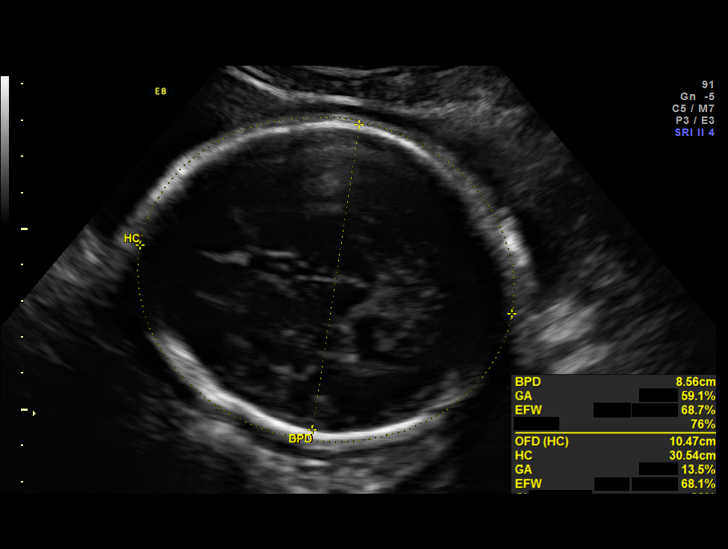

[12 of 28 positions shown; findings below may reference images not displayed]

FINDINGS: 1. Single intrauterine pregnancy.
2. Estimated fetal weight is in the 71st%.
3. Anterior placenta wtihout evidence of previa.
4. Normal amniotic fluid index.
5. The limited anatomy survey is normal.
Recommendations

1. Appropriate fetal growth.
2. Diabetes:
- on glyburide
- had normal fetal echocardiogram
- recommend fetal growth every 4 weeks
- recommend continue antenatal testing
3. Normal quad screen
